# Patient Record
Sex: Female | Born: 1950
Health system: Southern US, Community
[De-identification: ages and names within clinical notes are randomized; demographics above are authoritative.]

## PROBLEM LIST (undated history)

## (undated) DIAGNOSIS — T82110A Breakdown (mechanical) of cardiac electrode, initial encounter: Secondary | ICD-10-CM

## (undated) DIAGNOSIS — Z950 Presence of cardiac pacemaker: Secondary | ICD-10-CM

## (undated) DIAGNOSIS — I341 Nonrheumatic mitral (valve) prolapse: Secondary | ICD-10-CM

## (undated) DIAGNOSIS — N200 Calculus of kidney: Secondary | ICD-10-CM

## (undated) DIAGNOSIS — Q233 Congenital mitral insufficiency: Secondary | ICD-10-CM

## (undated) HISTORY — DX: Calculus of kidney: N20.0

## (undated) HISTORY — PX: TUBAL LIGATION: SHX77

## (undated) HISTORY — DX: Congenital mitral insufficiency: Q23.3

## (undated) HISTORY — DX: Nonrheumatic mitral (valve) prolapse: I34.1

---

## 2004-11-30 ENCOUNTER — Other Ambulatory Visit: Admission: RE | Admit: 2004-11-30 | Discharge: 2004-11-30 | Payer: Self-pay | Admitting: Family Medicine

## 2012-04-01 ENCOUNTER — Other Ambulatory Visit: Payer: Self-pay | Admitting: Family Medicine

## 2012-04-01 DIAGNOSIS — Z1231 Encounter for screening mammogram for malignant neoplasm of breast: Secondary | ICD-10-CM

## 2012-04-04 ENCOUNTER — Ambulatory Visit
Admission: RE | Admit: 2012-04-04 | Discharge: 2012-04-04 | Disposition: A | Discharge status: Home / Self Care | Payer: BC Managed Care – PPO | Source: Ambulatory Visit | Attending: Family Medicine | Admitting: Family Medicine

## 2012-04-04 DIAGNOSIS — Z1231 Encounter for screening mammogram for malignant neoplasm of breast: Secondary | ICD-10-CM

## 2012-09-25 HISTORY — PX: BREAST BIOPSY: SHX20

## 2013-04-01 ENCOUNTER — Other Ambulatory Visit: Payer: Self-pay

## 2013-04-01 DIAGNOSIS — Z1231 Encounter for screening mammogram for malignant neoplasm of breast: Secondary | ICD-10-CM

## 2013-04-25 ENCOUNTER — Ambulatory Visit
Admission: RE | Admit: 2013-04-25 | Discharge: 2013-04-25 | Disposition: A | Discharge status: Home / Self Care | Payer: BC Managed Care – PPO | Source: Ambulatory Visit

## 2013-04-25 DIAGNOSIS — Z1231 Encounter for screening mammogram for malignant neoplasm of breast: Secondary | ICD-10-CM

## 2013-04-29 ENCOUNTER — Other Ambulatory Visit: Payer: Self-pay | Admitting: Family Medicine

## 2013-04-29 DIAGNOSIS — R928 Other abnormal and inconclusive findings on diagnostic imaging of breast: Secondary | ICD-10-CM

## 2013-05-13 ENCOUNTER — Ambulatory Visit
Admission: RE | Admit: 2013-05-13 | Discharge: 2013-05-13 | Disposition: A | Discharge status: Home / Self Care | Payer: BC Managed Care – PPO | Source: Ambulatory Visit | Attending: Family Medicine | Admitting: Family Medicine

## 2013-05-13 ENCOUNTER — Other Ambulatory Visit: Payer: Self-pay | Admitting: Family Medicine

## 2013-05-13 DIAGNOSIS — R928 Other abnormal and inconclusive findings on diagnostic imaging of breast: Secondary | ICD-10-CM

## 2013-05-14 ENCOUNTER — Ambulatory Visit
Admission: RE | Admit: 2013-05-14 | Discharge: 2013-05-14 | Disposition: A | Discharge status: Home / Self Care | Payer: BC Managed Care – PPO | Source: Ambulatory Visit | Attending: Family Medicine | Admitting: Family Medicine

## 2013-05-14 DIAGNOSIS — R928 Other abnormal and inconclusive findings on diagnostic imaging of breast: Secondary | ICD-10-CM

## 2014-04-16 ENCOUNTER — Other Ambulatory Visit: Payer: Self-pay

## 2014-04-16 DIAGNOSIS — Z1231 Encounter for screening mammogram for malignant neoplasm of breast: Secondary | ICD-10-CM

## 2014-05-07 ENCOUNTER — Ambulatory Visit
Admission: RE | Admit: 2014-05-07 | Discharge: 2014-05-07 | Disposition: A | Discharge status: Home / Self Care | Payer: BC Managed Care – PPO | Source: Ambulatory Visit

## 2014-05-07 DIAGNOSIS — Z1231 Encounter for screening mammogram for malignant neoplasm of breast: Secondary | ICD-10-CM

## 2014-10-13 ENCOUNTER — Other Ambulatory Visit: Payer: Self-pay | Admitting: Gastroenterology

## 2014-10-22 ENCOUNTER — Telehealth: Payer: Self-pay | Admitting: Cardiology

## 2014-10-22 NOTE — Telephone Encounter (Signed)
Received records from [facility] [place] ( Dr Marinda Elkobert Fried) for appointment on 11/10/14 with Dr Rennis GoldenHilty.  [facility] for Dr Blanchie DessertHilty's schedule on 11/10/14. lp

## 2014-11-10 ENCOUNTER — Ambulatory Visit: Payer: BC Managed Care – PPO | Admitting: Cardiology

## 2014-11-10 ENCOUNTER — Ambulatory Visit (INDEPENDENT_AMBULATORY_CARE_PROVIDER_SITE_OTHER): Payer: BC Managed Care – PPO | Admitting: Cardiology

## 2014-11-10 ENCOUNTER — Encounter: Payer: Self-pay | Admitting: Cardiology

## 2014-11-10 VITALS — BP 112/80 | HR 85 | Ht 65.0 in | Wt 150.0 lb

## 2014-11-10 DIAGNOSIS — R9431 Abnormal electrocardiogram [ECG] [EKG]: Secondary | ICD-10-CM

## 2014-11-10 DIAGNOSIS — I340 Nonrheumatic mitral (valve) insufficiency: Secondary | ICD-10-CM

## 2014-11-10 DIAGNOSIS — I341 Nonrheumatic mitral (valve) prolapse: Secondary | ICD-10-CM

## 2014-11-10 DIAGNOSIS — I493 Ventricular premature depolarization: Secondary | ICD-10-CM

## 2014-11-10 NOTE — Progress Notes (Signed)
Cardiology Office Note   Date:  11/10/2014   ID:  Donna MileBrenda L Brady, DOB 05-31-1951, MRN [id number]  PCP:  Lenora BoysFRIED, ROBERT L, MD  Cardiologist:   Rollene RotundaJames Athanasios Heldman, MD   No chief complaint on file.     History of Present Illness: Donna MileBrenda L Boehner is a 64 y.o. female who presents for evaluation of PVCs. She was noted to have ectopy recently while having a colonoscopy. She had palpitations years ago and did have an echocardiogram. I was able to review the study from 2003. There was some mitral valve prolapse with moderate mitral regurgitation. However, the patient no longer feels palpitations. She says she feels well. She does some walking for exercise and does her household chores.  The patient denies any new symptoms such as chest discomfort, neck or arm discomfort. There has been no new shortness of breath, PND or orthopnea. There have been no reported palpitations, presyncope or syncope.   Past Medical History  Diagnosis Date  . MVP (mitral valve prolapse)   . MR (congenital mitral regurgitation)   . Nephrolithiasis     Past Surgical History  Procedure Laterality Date  . Tubal ligation    . Cesarean section      3     MEDS:  None     Allergies:   Review of patient's allergies indicates not on file.    Social History:  The patient  reports that she has never smoked. She does not have any smokeless tobacco history on file.   Family History:  The patient's family history includes Cancer in her father; Dementia in her mother; Parkinson's disease in her father.    ROS:  Please see the history of present illness.   Otherwise, review of systems are positive for none.   All other systems are reviewed and negative.    PHYSICAL EXAM: VS:  BP 112/80 mmHg  Pulse 85  Ht 5\' 5"  (1.651 m)  Wt 150 lb (68.04 kg)  BMI 24.96 kg/m2 , BMI Body mass index is 24.96 kg/(m^2). GEN: Well nourished, well developed, in no acute distress HEENT: normal Neck: no JVD, carotid bruits, or  masses Cardiac: RRR; no murmurs, rubs, or gallops,no edema, positive prolapse click Respiratory:  clear to auscultation bilaterally, normal work of breathing GI: soft, nontender, nondistended, + BS MS: no deformity or atrophy Skin: warm and dry, no rash Neuro:  Strength and sensation are intact Psych: euthymic mood, full affect   EKG:  EKG is ordered today. The ekg ordered today demonssinus rhythm, rate 85, right bundle branch block incomplete, premature ventricular contraction, no acute ST-T wave changes.    Wt Readings from Last 3 Encounters:  11/10/14 150 lb (68.04 kg)      Other studies Reviewed: Additional studies/ records that were reviewed today include: Echo 2003. Review of the above records demonstrates: As above   ASSESSMENT AND PLAN:  MVP:  The patient does have mitral valve prolapse with a click. I don't appreciate a murmur. However, she has moderate MR on echo. I will repeat an echocardiogram and follow this up as indicated.  PVCs:  She is not symptomatic. I doubt that further cardiovascular testing will be indicated. We did discuss the nature of these.   Current medicines are reviewed at length with the patient today.  The patient does not have concerns regarding medicines.  The following changes have been made:  no change  Labs/ tests ordered today include: echo  Orders Placed This Encounter  Procedures  .  EKG 12-Lead     Disposition:   FU with me as needed>  Signed, Rollene Rotunda, MD  11/10/2014 8:43 AM    [facility]

## 2014-11-10 NOTE — Patient Instructions (Signed)
Your physician recommends that you schedule a follow-up appointment in: as needed with Dr. Antoine PocheHochrein  We are ordering an echo

## 2014-11-17 ENCOUNTER — Ambulatory Visit (HOSPITAL_COMMUNITY)
Admission: RE | Admit: 2014-11-17 | Discharge: 2014-11-17 | Disposition: A | Discharge status: Home / Self Care | Payer: BC Managed Care – PPO | Source: Ambulatory Visit | Attending: Cardiology | Admitting: Cardiology

## 2014-11-17 DIAGNOSIS — I493 Ventricular premature depolarization: Secondary | ICD-10-CM | POA: Diagnosis not present

## 2014-11-17 DIAGNOSIS — I340 Nonrheumatic mitral (valve) insufficiency: Secondary | ICD-10-CM

## 2014-11-17 NOTE — Progress Notes (Signed)
2D Echocardiogram Complete.  11/17/2014   Tai Syfert, RDCS  

## 2015-05-03 ENCOUNTER — Other Ambulatory Visit: Payer: Self-pay

## 2015-05-03 DIAGNOSIS — Z1231 Encounter for screening mammogram for malignant neoplasm of breast: Secondary | ICD-10-CM

## 2015-06-14 ENCOUNTER — Ambulatory Visit
Admission: RE | Admit: 2015-06-14 | Discharge: 2015-06-14 | Disposition: A | Discharge status: Home / Self Care | Payer: BC Managed Care – PPO | Source: Ambulatory Visit

## 2015-06-14 DIAGNOSIS — Z1231 Encounter for screening mammogram for malignant neoplasm of breast: Secondary | ICD-10-CM

## 2016-05-23 ENCOUNTER — Other Ambulatory Visit: Payer: Self-pay | Admitting: Family Medicine

## 2016-05-23 DIAGNOSIS — Z1231 Encounter for screening mammogram for malignant neoplasm of breast: Secondary | ICD-10-CM

## 2016-06-19 ENCOUNTER — Ambulatory Visit
Admission: RE | Admit: 2016-06-19 | Discharge: 2016-06-19 | Disposition: A | Discharge status: Home / Self Care | Payer: BC Managed Care – PPO | Source: Ambulatory Visit | Attending: Family Medicine | Admitting: Family Medicine

## 2016-06-19 DIAGNOSIS — Z1231 Encounter for screening mammogram for malignant neoplasm of breast: Secondary | ICD-10-CM

## 2016-06-22 ENCOUNTER — Other Ambulatory Visit: Payer: Self-pay | Admitting: Family Medicine

## 2016-06-22 DIAGNOSIS — R928 Other abnormal and inconclusive findings on diagnostic imaging of breast: Secondary | ICD-10-CM

## 2016-06-28 ENCOUNTER — Ambulatory Visit
Admission: RE | Admit: 2016-06-28 | Discharge: 2016-06-28 | Disposition: A | Discharge status: Home / Self Care | Payer: Medicare Other | Source: Ambulatory Visit | Attending: Family Medicine | Admitting: Family Medicine

## 2016-06-28 DIAGNOSIS — R928 Other abnormal and inconclusive findings on diagnostic imaging of breast: Secondary | ICD-10-CM

## 2017-05-23 ENCOUNTER — Other Ambulatory Visit: Payer: Self-pay | Admitting: Family Medicine

## 2017-05-23 DIAGNOSIS — Z1231 Encounter for screening mammogram for malignant neoplasm of breast: Secondary | ICD-10-CM

## 2017-06-25 ENCOUNTER — Ambulatory Visit: Payer: Medicare Other

## 2017-07-03 ENCOUNTER — Encounter: Payer: Self-pay | Admitting: Radiology

## 2017-07-03 ENCOUNTER — Ambulatory Visit
Admission: RE | Admit: 2017-07-03 | Discharge: 2017-07-03 | Disposition: A | Discharge status: Home / Self Care | Payer: Medicare Other | Source: Ambulatory Visit | Attending: Family Medicine | Admitting: Family Medicine

## 2017-07-03 DIAGNOSIS — Z1231 Encounter for screening mammogram for malignant neoplasm of breast: Secondary | ICD-10-CM

## 2018-05-28 ENCOUNTER — Other Ambulatory Visit: Payer: Self-pay | Admitting: Family Medicine

## 2018-05-28 DIAGNOSIS — Z1231 Encounter for screening mammogram for malignant neoplasm of breast: Secondary | ICD-10-CM

## 2018-07-09 ENCOUNTER — Ambulatory Visit: Payer: Medicare Other

## 2018-07-09 ENCOUNTER — Ambulatory Visit
Admission: RE | Admit: 2018-07-09 | Discharge: 2018-07-09 | Disposition: A | Discharge status: Home / Self Care | Payer: Medicare Other | Source: Ambulatory Visit | Attending: Family Medicine | Admitting: Family Medicine

## 2018-07-09 DIAGNOSIS — Z1231 Encounter for screening mammogram for malignant neoplasm of breast: Secondary | ICD-10-CM

## 2018-11-21 ENCOUNTER — Emergency Department (HOSPITAL_COMMUNITY): Payer: Medicare Other

## 2018-11-21 ENCOUNTER — Inpatient Hospital Stay (HOSPITAL_COMMUNITY)
Admission: EM | Admit: 2018-11-21 | Discharge: 2018-11-25 | DRG: 243 | Disposition: A | Discharge status: Home / Self Care | Payer: Medicare Other | Source: Ambulatory Visit | Attending: Cardiology | Admitting: Cardiology

## 2018-11-21 ENCOUNTER — Other Ambulatory Visit: Payer: Self-pay

## 2018-11-21 ENCOUNTER — Encounter (HOSPITAL_COMMUNITY): Payer: Self-pay

## 2018-11-21 DIAGNOSIS — I459 Conduction disorder, unspecified: Secondary | ICD-10-CM | POA: Diagnosis present

## 2018-11-21 DIAGNOSIS — L7632 Postprocedural hematoma of skin and subcutaneous tissue following other procedure: Secondary | ICD-10-CM | POA: Diagnosis not present

## 2018-11-21 DIAGNOSIS — R001 Bradycardia, unspecified: Secondary | ICD-10-CM | POA: Diagnosis present

## 2018-11-21 DIAGNOSIS — Q233 Congenital mitral insufficiency: Secondary | ICD-10-CM

## 2018-11-21 DIAGNOSIS — Z950 Presence of cardiac pacemaker: Secondary | ICD-10-CM | POA: Diagnosis not present

## 2018-11-21 DIAGNOSIS — Y712 Prosthetic and other implants, materials and accessory cardiovascular devices associated with adverse incidents: Secondary | ICD-10-CM | POA: Diagnosis not present

## 2018-11-21 DIAGNOSIS — T82190A Other mechanical complication of cardiac electrode, initial encounter: Secondary | ICD-10-CM | POA: Diagnosis not present

## 2018-11-21 DIAGNOSIS — Z95818 Presence of other cardiac implants and grafts: Secondary | ICD-10-CM

## 2018-11-21 DIAGNOSIS — I10 Essential (primary) hypertension: Secondary | ICD-10-CM | POA: Diagnosis not present

## 2018-11-21 DIAGNOSIS — Z87442 Personal history of urinary calculi: Secondary | ICD-10-CM | POA: Diagnosis not present

## 2018-11-21 DIAGNOSIS — Z820 Family history of epilepsy and other diseases of the nervous system: Secondary | ICD-10-CM | POA: Diagnosis not present

## 2018-11-21 DIAGNOSIS — Z959 Presence of cardiac and vascular implant and graft, unspecified: Secondary | ICD-10-CM

## 2018-11-21 DIAGNOSIS — I441 Atrioventricular block, second degree: Secondary | ICD-10-CM | POA: Diagnosis not present

## 2018-11-21 DIAGNOSIS — T82110A Breakdown (mechanical) of cardiac electrode, initial encounter: Secondary | ICD-10-CM | POA: Diagnosis not present

## 2018-11-21 DIAGNOSIS — I4510 Unspecified right bundle-branch block: Secondary | ICD-10-CM | POA: Diagnosis present

## 2018-11-21 DIAGNOSIS — T82110D Breakdown (mechanical) of cardiac electrode, subsequent encounter: Secondary | ICD-10-CM

## 2018-11-21 DIAGNOSIS — I341 Nonrheumatic mitral (valve) prolapse: Secondary | ICD-10-CM | POA: Diagnosis present

## 2018-11-21 DIAGNOSIS — R0781 Pleurodynia: Secondary | ICD-10-CM

## 2018-11-21 HISTORY — DX: Breakdown (mechanical) of cardiac electrode, initial encounter: T82.110A

## 2018-11-21 HISTORY — DX: Presence of cardiac pacemaker: Z95.0

## 2018-11-21 LAB — CBC
HCT: 42.1 % (ref 36.0–46.0)
HCT: 39.9 % (ref 36.0–46.0)
Hemoglobin: 13.1 g/dL (ref 12.0–15.0)
Hemoglobin: 12.7 g/dL (ref 12.0–15.0)
MCH: 29 pg (ref 26.0–34.0)
MCH: 28.4 pg (ref 26.0–34.0)
MCHC: 31.1 g/dL (ref 30.0–36.0)
MCHC: 31.8 g/dL (ref 30.0–36.0)
MCV: 93.1 fL (ref 80.0–100.0)
MCV: 89.3 fL (ref 80.0–100.0)
NRBC: 0 % (ref 0.0–0.2)
NRBC: 0 % (ref 0.0–0.2)
PLATELETS: 208 10*3/uL (ref 150–400)
Platelets: 202 10*3/uL (ref 150–400)
RBC: 4.52 MIL/uL (ref 3.87–5.11)
RBC: 4.47 MIL/uL (ref 3.87–5.11)
RDW: 13.2 % (ref 11.5–15.5)
RDW: 13.2 % (ref 11.5–15.5)
WBC: 7 10*3/uL (ref 4.0–10.5)
WBC: 7.4 10*3/uL (ref 4.0–10.5)

## 2018-11-21 LAB — BASIC METABOLIC PANEL
Anion gap: 8 (ref 5–15)
BUN: 17 mg/dL (ref 8–23)
CHLORIDE: 110 mmol/L (ref 98–111)
CO2: 25 mmol/L (ref 22–32)
Calcium: 9.9 mg/dL (ref 8.9–10.3)
Creatinine, Ser: 0.71 mg/dL (ref 0.44–1.00)
GFR calc non Af Amer: >60 mL/min (ref >60)
Glucose, Bld: 104 mg/dL — ABNORMAL HIGH (ref 70–99)
Potassium: 3.8 mmol/L (ref 3.5–5.1)
SODIUM: 143 mmol/L (ref 135–145)

## 2018-11-21 LAB — I-STAT TROPONIN, ED: Troponin i, poc: 0 ng/mL (ref 0.00–0.08)

## 2018-11-21 LAB — MAGNESIUM: Magnesium: 2.1 mg/dL (ref 1.7–2.4)

## 2018-11-21 LAB — CREATININE, SERUM
Creatinine, Ser: 0.77 mg/dL (ref 0.44–1.00)
GFR calc Af Amer: >60 mL/min (ref >60)

## 2018-11-21 LAB — TSH: TSH: 4.697 u[IU]/mL — ABNORMAL HIGH (ref 0.350–4.500)

## 2018-11-21 LAB — SEDIMENTATION RATE: Sed Rate: 3 mm/hr (ref 0–22)

## 2018-11-21 MED ORDER — SODIUM CHLORIDE 0.9% FLUSH: 3.0000 mL | INTRAVENOUS | Status: DC | PRN

## 2018-11-21 MED ORDER — SODIUM CHLORIDE 0.9% FLUSH: 3.0000 mL | Freq: Once | INTRAVENOUS | Status: DC

## 2018-11-21 MED ORDER — HEPARIN SODIUM (PORCINE) 5000 UNIT/ML IJ SOLN
5000.0000 [IU] | Freq: Three times a day (TID) | INTRAMUSCULAR | Status: DC
  Administered 2018-11-21 – 2018-11-22 (×2): 5000 [IU] via SUBCUTANEOUS
  Filled 2018-11-21: qty 1

## 2018-11-21 MED ORDER — SODIUM CHLORIDE 0.9 % IV SOLN
250.0000 mL | INTRAVENOUS | Status: DC | PRN
  Administered 2018-11-23: 250 mL via INTRAVENOUS

## 2018-11-21 MED ORDER — SODIUM CHLORIDE 0.9% FLUSH
3.0000 mL | Freq: Two times a day (BID) | INTRAVENOUS | Status: DC
Start: 2018-11-21 — End: 2018-11-25
  Administered 2018-11-21 – 2018-11-24 (×5): 3 mL via INTRAVENOUS

## 2018-11-21 NOTE — ED Notes (Signed)
ED TO INPATIENT HANDOFF REPORT  Name/Age/Gender Donna Brady 68 y.o. female  Code Status   Home/SNF/Other Home  Chief Complaint abnormal EKG   Level of Care/Admitting Diagnosis ED Disposition    ED Disposition Condition Comment   Admit  Hospital Area: [facility] [[id number]]  Level of Care: Progressive [102]  Diagnosis: AV block, Mobitz 2 [500938]  Admitting Physician: Lars Masson [[id number]]  Attending Physician: Lars Masson [[id number]]  Estimated length of stay: past midnight tomorrow  Certification:: I certify this patient will need inpatient services for at least 2 midnights  PT Class (Do Not Modify): Inpatient [101]  PT Acc Code (Do Not Modify): Private [1]       Medical History Past Medical History:  Diagnosis Date  . MR (congenital mitral regurgitation)   . MVP (mitral valve prolapse)   . Nephrolithiasis     Allergies Allergies  Allergen Reactions  . Adhesive [Tape] Hives    IV Location/Drains/Wounds Patient Lines/Drains/Airways Status   Active Line/Drains/Airways    Name:   Placement date:   Placement time:   Site:   Days:   Peripheral IV 11/21/18 Right Antecubital   11/21/18    1529    Antecubital   less than 1          Labs/Imaging Results for orders placed or performed during the hospital encounter of 11/21/18 (from the past 48 hour(s))  I-stat troponin, ED     Status: None   Collection Time: 11/21/18  3:27 PM  Result Value Ref Range   Troponin i, poc 0.00 0.00 - 0.08 ng/mL   Comment 3            Comment: Due to the release kinetics of cTnI, a negative result within the first hours of the onset of symptoms does not rule out myocardial infarction with certainty. If myocardial infarction is still suspected, repeat the test at appropriate intervals.   Basic metabolic panel     Status: Abnormal   Collection Time: 11/21/18  3:29 PM  Result Value Ref Range   Sodium 143 135 - 145 mmol/L   Potassium 3.8 3.5 - 5.1  mmol/L   Chloride 110 98 - 111 mmol/L   CO2 25 22 - 32 mmol/L   Glucose, Bld 104 (H) 70 - 99 mg/dL   BUN 17 8 - 23 mg/dL   Creatinine, Ser 3.81 0.44 - 1.00 mg/dL   Calcium 9.9 8.9 - 01.7 mg/dL   GFR calc non Af Amer >60 >60 mL/min   GFR calc Af Amer >60 >60 mL/min   Anion gap 8 5 - 15    Comment: Performed at [facility], [address] [place], [place] [address]  CBC     Status: None   Collection Time: 11/21/18  3:29 PM  Result Value Ref Range   WBC 7.0 4.0 - 10.5 K/uL   RBC 4.52 3.87 - 5.11 MIL/uL   Hemoglobin 13.1 12.0 - 15.0 g/dL   HCT 85.2 77.8 - 24.2 %   MCV 93.1 80.0 - 100.0 fL   MCH 29.0 26.0 - 34.0 pg   MCHC 31.1 30.0 - 36.0 g/dL   RDW 35.3 61.4 - 43.1 %   Platelets 208 150 - 400 K/uL   nRBC 0.0 0.0 - 0.2 %    Comment: Performed at [facility], [address], [place], [place] [address]   Dg Chest 2 View  Result Date: 11/21/2018 CLINICAL DATA:  68 y/o  F; abnormal EKG. EXAM: CHEST - 2 VIEW COMPARISON:  None. FINDINGS: The heart size and mediastinal contours are within normal limits. Both lungs are clear. The visualized skeletal structures are unremarkable. IMPRESSION: No acute pulmonary process identified. Cardiomediastinal silhouette within normal limits. Electronically Signed   By: Mitzi Hansen M.D.   On: 11/21/2018 15:59   EKG Interpretation  Date/Time:  Thursday November 21 2018 15:12:23 EST Ventricular Rate:  38 PR Interval:    QRS Duration: 128 QT Interval:  505 QTC Calculation: 402 R Axis:   91 Text Interpretation:  Sinus bradycardia Ventricular premature complex IVCD, consider atypical RBBB Abnormal T, consider ischemia, lateral leads No old tracing to compare 3rd degree av block Confirmed by Derwood Kaplan [phone]) on 11/21/2018 3:17:36 PM   Pending Labs Unresulted Labs (From admission, onward)    Start     Ordered   11/21/18 1529  Magnesium  ONCE - STAT,   STAT     11/21/18 1528           Vitals/Pain Today's Vitals   11/21/18 1510 11/21/18 1511 11/21/18 1512  BP:   (!) 132/43  Pulse:   (!) 37  Resp:   (!) 22  Temp:   98.2 F (36.8 C)  TempSrc:   Oral  SpO2:   99%  Weight:  67.1 kg   Height:  5\' 5"  (1.651 m)   PainSc: 0-No pain      Isolation Precautions No active isolations  Medications Medications  sodium chloride flush (NS) 0.9 % injection 3 mL (3 mLs Intravenous Not Given 11/21/18 1547)    Mobility walks

## 2018-11-21 NOTE — ED Triage Notes (Addendum)
Pt reports from her doctor's office with c/o abnormal EKG. Pt reports that her BP was elevated and she could feel her pulse in her head which is what sent her to the MD's office. Pt reports that they told her to come here reference an abnormal EKG that they took at the office and the fact that she was bradycardic.

## 2018-11-21 NOTE — ED Notes (Signed)
MD Rhunette Croft made aware of pt's low heart rate.

## 2018-11-21 NOTE — Progress Notes (Signed)
@  2145 Paged Dr. Vonzella Nipple on-call for Attending regarding pt's refusal of morning Cardiac MRI 2/2 severe claustrophobia. Will convey to Day RN to inform Day Team as well.

## 2018-11-21 NOTE — ED Provider Notes (Signed)
[facility]-EMERGENCY DEPT Provider Note   CSN: [id number] Arrival date & time: 11/21/18  1505    History   Chief Complaint Chief Complaint  Patient presents with  . Abnormal ECG    HPI Donna Brady is a 68 y.o. female.     HPI  68 year old female with history of mitral valve prolapse and mitral regurg comes in with chief complaint of abnormal EKG. Patient states that she woke up this morning and was having " whooshing" sound in her ear.  She thought that was abnormal and decided to check her vital signs and noted that her blood pressure was slightly high and her heart rate was in the 30s.  Patient proceeded to see primary care physician who confirmed the low pulse on EKG and sent her to the emergency room for further evaluation.  Patient is denying any history of hypertension and does not take any blood pressure medication.  She also denies any history of heart attack, tick bites, family history of sick sinus syndrome.   Review of system is negative for chest pain, shortness of breath, dizziness, near fainting.  Past Medical History:  Diagnosis Date  . MR (congenital mitral regurgitation)   . MVP (mitral valve prolapse)   . Nephrolithiasis     Patient Active Problem List   Diagnosis Date Noted  . AV block, Mobitz 2 11/21/2018  . Mitral regurgitation 11/10/2014  . MVP (mitral valve prolapse) 11/10/2014  . PVC's (premature ventricular contractions) 11/10/2014    Past Surgical History:  Procedure Laterality Date  . BREAST BIOPSY Right 2014  . CESAREAN SECTION     3  . TUBAL LIGATION       OB History   No obstetric history on file.      Home Medications    Prior to Admission medications   Medication Sig Start Date End Date Taking? Authorizing Provider  acetaminophen (TYLENOL) 325 MG tablet Take 650 mg by mouth every 6 (six) hours as needed for moderate pain or headache.   Yes [provider]  Multiple Vitamin (MULTIVITAMIN)  tablet Take 1 tablet by mouth daily.   Yes [provider]    Family History Family History  Problem Relation Age of Onset  . Cancer Father   . Parkinson's disease Father   . Dementia Mother     Social History Social History   Tobacco Use  . Smoking status: Never Smoker  . Smokeless tobacco: Never Used  Substance Use Topics  . Alcohol use: Not on file  . Drug use: Not on file     Allergies   Adhesive [tape]   Review of Systems Review of Systems  Constitutional: Negative for activity change.  Respiratory: Negative for shortness of breath.   Cardiovascular: Negative for chest pain.  Neurological: Negative for light-headedness.  All other systems reviewed and are negative.    Physical Exam Updated Vital Signs BP (!) 114/97   Pulse (!) 33   Temp 98.2 F (36.8 C) (Oral)   Resp 16   Ht 5\' 5"  (1.651 m)   Wt 67.1 kg   SpO2 100%   BMI 24.63 kg/m   Physical Exam Vitals signs and nursing note reviewed.  Constitutional:      Appearance: She is well-developed.  HENT:     Head: Normocephalic and atraumatic.  Neck:     Musculoskeletal: Normal range of motion and neck supple.  Cardiovascular:     Rate and Rhythm: Bradycardia present.  Pulmonary:  Effort: Pulmonary effort is normal.  Abdominal:     General: Bowel sounds are normal.  Skin:    General: Skin is warm and dry.  Neurological:     Mental Status: She is alert and oriented to person, place, and time.      ED Treatments / Results  Labs (all labs ordered are listed, but only abnormal results are displayed) Labs Reviewed  BASIC METABOLIC PANEL - Abnormal; Notable for the following components:      Result Value   Glucose, Bld 104 (*)    All other components within normal limits  CBC  MAGNESIUM  I-STAT TROPONIN, ED    EKG EKG Interpretation  Date/Time:  Thursday November 21 2018 15:12:23 EST Ventricular Rate:  38 PR Interval:    QRS Duration: 128 QT Interval:  505 QTC  Calculation: 402 R Axis:   91 Text Interpretation:  Sinus bradycardia Ventricular premature complex IVCD, consider atypical RBBB Abnormal T, consider ischemia, lateral leads No old tracing to compare 3rd degree av block Confirmed by Derwood Kaplan [phone]) on 11/21/2018 3:17:36 PM   Radiology Dg Chest 2 View  Result Date: 11/21/2018 CLINICAL DATA:  68 y/o  F; abnormal EKG. EXAM: CHEST - 2 VIEW COMPARISON:  None. FINDINGS: The heart size and mediastinal contours are within normal limits. Both lungs are clear. The visualized skeletal structures are unremarkable. IMPRESSION: No acute pulmonary process identified. Cardiomediastinal silhouette within normal limits. Electronically Signed   By: Mitzi Hansen M.D.   On: 11/21/2018 15:59    Procedures Procedures (including critical care time)  Medications Ordered in ED Medications  sodium chloride flush (NS) 0.9 % injection 3 mL (3 mLs Intravenous Not Given 11/21/18 1547)     Initial Impression / Assessment and Plan / ED Course  I have reviewed the triage vital signs and the nursing notes.  Pertinent labs & imaging results that were available during my care of the patient were reviewed by me and considered in my medical decision making (see chart for details).        68 year old female comes in with chief complaint of low heart rate.  It appears that she is asymptomatic with it, and quite possibly walking around with bradycardia for few days.  She does not have any ischemic findings on the EKG and she denies any chest pain, shortness of breath, dizziness or near fainting.   She has history of MVP, therefore this could be a structural problem leading to abnormal circuit issues.  It does not appear that the bradycardia is because of toxins or medications.  I discussed the case with Dr. Delton See, who would like the patient to be admitted to [facility] progressive unit.  Final Clinical Impressions(s) / ED Diagnoses   Final  diagnoses:  Mobitz type 2 second degree atrioventricular block    ED Discharge Orders    None       Derwood Kaplan, MD 11/21/18 1746

## 2018-11-21 NOTE — H&P (Signed)
Cardiology Admission History and Physical:   Patient ID: Donna Brady MRN: [id number]; DOB: September 20, 1951   Admission date: 11/21/2018  Primary Care Provider: Joycelyn Rua, MD Primary Cardiologist: Dr. Antoine Poche Chief Complaint: Weakness, dizziness  Patient Profile:   Donna Brady is a 68 y.o. female with new diagnosis of advanced AV block.  History of Present Illness:   Donna Brady is a pleasant [age] year old female who was previously evaluated by Dr. Antoine Poche for PVCs during the colonoscopy procedure, at the time she was otherwise asymptomatic and underwent echocardiography that showed EF of 60 to 65% with mild mitral valve prolapse with moderate mitral regurgitation.  She otherwise has no other cardiac history such as hypertension or hyperlipidemia basically on no medications. She states that she woke up today with bruit in her ears that is not normal for her so she checked her vital sounds and noted that her blood pressure was slightly elevated but her heart rate was in 30s.  She den went to her primary care physician office who confirmed her bradycardia and she was sent to the ER.  The patient is on no antihypertensive medications, she also denies any recent fever or chills, no nausea or vomiting, no recent febrile illness no tick bites.    She has no h/o thyroid disease, denies any recent weigt gain or loss, no acute febrile illness.  She also denies any dizziness, no presyncope or syncope. She admits to some fatigue in the afternoons in the last week or two and some SOB while walking her dog uphill this am.    Past Medical History:  Diagnosis Date  . MR (congenital mitral regurgitation)   . MVP (mitral valve prolapse)   . Nephrolithiasis     Past Surgical History:  Procedure Laterality Date  . BREAST BIOPSY Right 2014  . CESAREAN SECTION     3  . TUBAL LIGATION       Medications Prior to Admission: Prior to Admission medications   Medication Sig Start Date End  Date Taking? Authorizing Provider  acetaminophen (TYLENOL) 325 MG tablet Take 650 mg by mouth every 6 (six) hours as needed for moderate pain or headache.   Yes [provider]  Multiple Vitamin (MULTIVITAMIN) tablet Take 1 tablet by mouth daily.   Yes [provider]     Allergies:    Allergies  Allergen Reactions  . Adhesive [Tape] Hives    Social History:   Social History   Socioeconomic History  . Marital status: Married    Spouse name: Not on file  . Number of children: 3  . Years of education: Not on file  . Highest education level: Not on file  Occupational History  . Not on file  Social Needs  . Financial resource strain: Not on file  . Food insecurity:    Worry: Not on file    Inability: Not on file  . Transportation needs:    Medical: Not on file    Non-medical: Not on file  Tobacco Use  . Smoking status: Never Smoker  . Smokeless tobacco: Never Used  Substance and Sexual Activity  . Alcohol use: Not on file  . Drug use: Not on file  . Sexual activity: Not on file  Lifestyle  . Physical activity:    Days per week: Not on file    Minutes per session: Not on file  . Stress: Not on file  Relationships  . Social connections:    Talks on phone: Not  on file    Gets together: Not on file    Attends religious service: Not on file    Active member of club or organization: Not on file    Attends meetings of clubs or organizations: Not on file    Relationship status: Not on file  . Intimate partner violence:    Fear of current or ex partner: Not on file    Emotionally abused: Not on file    Physically abused: Not on file    Forced sexual activity: Not on file  Other Topics Concern  . Not on file  Social History Narrative   Lives at home with husband.    Family History:   The patient's family history includes Cancer in her father; Dementia in her mother; Parkinson's disease in her father.    ROS:  Please see the history of present  illness.  All other ROS reviewed and negative.     Physical Exam/Data:   Vitals:   11/21/18 1615 11/21/18 1630 11/21/18 1645 11/21/18 1700  BP: 112/61 129/65 124/65 (!) 114/97  Pulse: (!) 35 (!) 37 (!) 36 (!) 33  Resp: 17 15 14 16   Temp:      TempSrc:      SpO2: 95% 99% 97% 100%  Weight:      Height:       No intake or output data in the 24 hours ending 11/21/18 1809 Last 3 Weights 11/21/2018 11/10/2014  Weight (lbs) 148 lb 150 lb  Weight (kg) 67.132 kg 68.04 kg     Body mass index is 24.63 kg/m.  General:  Well nourished, well developed, in no acute distress HEENT: normal Lymph: no adenopathy Neck: no JVD Endocrine:  No thryomegaly Vascular: No carotid bruits; FA pulses 2+ bilaterally without bruits  Cardiac:  normal S1, S2; RRR; 3 out of 6 systolic murmur Lungs:  clear to auscultation bilaterally, no wheezing, rhonchi or rales  Abd: soft, nontender, no hepatomegaly  Ext: no edema Musculoskeletal:  No deformities, BUE and BLE strength normal and equal Skin: warm and dry  Neuro:  CNs 2-12 intact, no focal abnormalities noted Psych:  Normal affect    EKG:  The ECG that was done was personally reviewed and demonstrates a high degree 3:1 block with RBBB  Relevant CV Studies:  Echocardiogram 11/17/2014  - Left ventricle: The cavity size was normal. Systolic function was normal. The estimated ejection fraction was in the range of 55% to 60%. Wall motion was normal; there were no regional wall motion abnormalities. Doppler parameters are consistent with abnormal left ventricular relaxation (grade 1 diastolic dysfunction). - Mitral valve: There was mild regurgitation.  Impressions:  - There was no obvious prolapse of mitral valve as was reported on 2003 echocardiogram.  Laboratory Data:  Chemistry Recent Labs  Lab 11/21/18 1529  NA 143  K 3.8  CL 110  CO2 25  GLUCOSE 104*  BUN 17  CREATININE 0.71  CALCIUM 9.9  GFRNONAA >60  GFRAA >60    ANIONGAP 8    No results for input(s): PROT, ALBUMIN, AST, ALT, ALKPHOS, BILITOT in the last 168 hours. Hematology Recent Labs  Lab 11/21/18 1529  WBC 7.0  RBC 4.52  HGB 13.1  HCT 42.1  MCV 93.1  MCH 29.0  MCHC 31.1  RDW 13.2  PLT 208   Cardiac EnzymesNo results for input(s): TROPONINI in the last 168 hours.  Recent Labs  Lab 11/21/18 1527  TROPIPOC 0.00    BNPNo results for input(s):  BNP, PROBNP in the last 168 hours.  DDimer No results for input(s): DDIMER in the last 168 hours.  Radiology/Studies:  Dg Chest 2 View  Result Date: 11/21/2018 CLINICAL DATA:  68 y/o  F; abnormal EKG. EXAM: CHEST - 2 VIEW COMPARISON:  None. FINDINGS: The heart size and mediastinal contours are within normal limits. Both lungs are clear. The visualized skeletal structures are unremarkable. IMPRESSION: No acute pulmonary process identified. Cardiomediastinal silhouette within normal limits. Electronically Signed   By: Mitzi Hansen M.D.   On: 11/21/2018 15:59    Assessment and Plan:   1. High degree 3:1 block with RBBB - no obvious preceding illness, no tick bite, thyroid disease, no meds, no evidence for a sarcoidosis - I will obtain TSH. FT3,fT4, sed rate - obtain echocardiogram and cardiac MRI - EP consult in the am and most probably a PM placement tomorrow - no need for a temporary pacer as she is asymptomatic and BP in 140'  Severity of Illness: The appropriate patient status for this patient is INPATIENT. Inpatient status is judged to be reasonable and necessary in order to provide the required intensity of service to ensure the patient's safety. The patient's presenting symptoms, physical exam findings, and initial radiographic and laboratory data in the context of their chronic comorbidities is felt to place them at high risk for further clinical deterioration. Furthermore, it is not anticipated that the patient will be medically stable for discharge from the hospital within  2 midnights of admission. The following factors support the patient status of inpatient.   " The patient's presenting symptoms include SOB " The worrisome physical exam findings include high degree AVB. " The initial radiographic and laboratory data are worrisome because of high degree AVB. " The chronic co-morbidities include HTN.  * I certify that at the point of admission it is my clinical judgment that the patient will require inpatient hospital care spanning beyond 2 midnights from the point of admission due to high intensity of service, high risk for further deterioration and high frequency of surveillance required.*   For questions or updates, please contact [facility] Please consult [email or web address] for contact info under   Signed, Tobias Alexander, MD  11/21/2018 6:09 PM

## 2018-11-22 ENCOUNTER — Inpatient Hospital Stay (HOSPITAL_COMMUNITY)
Admission: EM | Disposition: A | Discharge status: Home / Self Care | Payer: Self-pay | Source: Ambulatory Visit | Attending: Cardiology

## 2018-11-22 ENCOUNTER — Observation Stay (HOSPITAL_COMMUNITY): Payer: Medicare Other

## 2018-11-22 DIAGNOSIS — I340 Nonrheumatic mitral (valve) insufficiency: Secondary | ICD-10-CM

## 2018-11-22 DIAGNOSIS — Y712 Prosthetic and other implants, materials and accessory cardiovascular devices associated with adverse incidents: Secondary | ICD-10-CM | POA: Diagnosis present

## 2018-11-22 DIAGNOSIS — Z820 Family history of epilepsy and other diseases of the nervous system: Secondary | ICD-10-CM | POA: Diagnosis not present

## 2018-11-22 DIAGNOSIS — I4510 Unspecified right bundle-branch block: Secondary | ICD-10-CM | POA: Diagnosis present

## 2018-11-22 DIAGNOSIS — L7632 Postprocedural hematoma of skin and subcutaneous tissue following other procedure: Secondary | ICD-10-CM | POA: Diagnosis present

## 2018-11-22 DIAGNOSIS — I441 Atrioventricular block, second degree: Secondary | ICD-10-CM | POA: Diagnosis present

## 2018-11-22 DIAGNOSIS — Z87442 Personal history of urinary calculi: Secondary | ICD-10-CM | POA: Diagnosis not present

## 2018-11-22 DIAGNOSIS — I10 Essential (primary) hypertension: Secondary | ICD-10-CM | POA: Diagnosis present

## 2018-11-22 DIAGNOSIS — T82190A Other mechanical complication of cardiac electrode, initial encounter: Secondary | ICD-10-CM | POA: Diagnosis not present

## 2018-11-22 DIAGNOSIS — T82110A Breakdown (mechanical) of cardiac electrode, initial encounter: Secondary | ICD-10-CM | POA: Diagnosis not present

## 2018-11-22 DIAGNOSIS — Q233 Congenital mitral insufficiency: Secondary | ICD-10-CM | POA: Diagnosis not present

## 2018-11-22 DIAGNOSIS — R001 Bradycardia, unspecified: Secondary | ICD-10-CM | POA: Diagnosis present

## 2018-11-22 HISTORY — PX: PACEMAKER IMPLANT: EP1218

## 2018-11-22 LAB — CBC
HCT: 38.3 % (ref 36.0–46.0)
Hemoglobin: 12 g/dL (ref 12.0–15.0)
MCH: 28.1 pg (ref 26.0–34.0)
MCHC: 31.3 g/dL (ref 30.0–36.0)
MCV: 89.7 fL (ref 80.0–100.0)
NRBC: 0 % (ref 0.0–0.2)
Platelets: 184 10*3/uL (ref 150–400)
RBC: 4.27 MIL/uL (ref 3.87–5.11)
RDW: 13.2 % (ref 11.5–15.5)
WBC: 6.8 10*3/uL (ref 4.0–10.5)

## 2018-11-22 LAB — SURGICAL PCR SCREEN
MRSA, PCR: NEGATIVE
Staphylococcus aureus: NEGATIVE

## 2018-11-22 LAB — BASIC METABOLIC PANEL
Anion gap: 7 (ref 5–15)
BUN: 14 mg/dL (ref 8–23)
CO2: 25 mmol/L (ref 22–32)
CREATININE: 0.75 mg/dL (ref 0.44–1.00)
Calcium: 9.1 mg/dL (ref 8.9–10.3)
Chloride: 108 mmol/L (ref 98–111)
GFR calc Af Amer: >60 mL/min (ref >60)
GFR calc non Af Amer: >60 mL/min (ref >60)
Glucose, Bld: 97 mg/dL (ref 70–99)
Potassium: 3.9 mmol/L (ref 3.5–5.1)
Sodium: 140 mmol/L (ref 135–145)

## 2018-11-22 LAB — T4, FREE: Free T4: 0.88 ng/dL (ref 0.82–1.77)

## 2018-11-22 LAB — ECHOCARDIOGRAM COMPLETE
Height: 65 in
Weight: 2402.13 oz

## 2018-11-22 LAB — HIV ANTIBODY (ROUTINE TESTING W REFLEX): HIV Screen 4th Generation wRfx: NONREACTIVE

## 2018-11-22 SURGERY — PACEMAKER IMPLANT

## 2018-11-22 MED ORDER — SODIUM CHLORIDE 0.9 % IV SOLN
INTRAVENOUS | Status: AC
  Filled 2018-11-22: qty 2

## 2018-11-22 MED ORDER — SODIUM CHLORIDE 0.9% FLUSH: 3.0000 mL | INTRAVENOUS | Status: DC | PRN

## 2018-11-22 MED ORDER — LIDOCAINE HCL (PF) 1 % IJ SOLN
INTRAMUSCULAR | Status: DC | PRN
  Administered 2018-11-22: 60 mL

## 2018-11-22 MED ORDER — SODIUM CHLORIDE 0.9% FLUSH: 3.0000 mL | Freq: Two times a day (BID) | INTRAVENOUS | Status: DC

## 2018-11-22 MED ORDER — SODIUM CHLORIDE 0.9 % IV SOLN
INTRAVENOUS | Status: DC
  Administered 2018-11-22: 13:00:00 via INTRAVENOUS

## 2018-11-22 MED ORDER — MIDAZOLAM HCL 5 MG/5ML IJ SOLN
INTRAMUSCULAR | Status: AC
  Filled 2018-11-22: qty 5

## 2018-11-22 MED ORDER — IOPAMIDOL (ISOVUE-370) INJECTION 76%
INTRAVENOUS | Status: AC
  Filled 2018-11-22: qty 50

## 2018-11-22 MED ORDER — CEFAZOLIN SODIUM-DEXTROSE 1-4 GM/50ML-% IV SOLN
1.0000 g | Freq: Four times a day (QID) | INTRAVENOUS | Status: AC
  Administered 2018-11-22 – 2018-11-23 (×3): 1 g via INTRAVENOUS
  Filled 2018-11-22 (×3): qty 50

## 2018-11-22 MED ORDER — FENTANYL CITRATE (PF) 100 MCG/2ML IJ SOLN
INTRAMUSCULAR | Status: AC
  Filled 2018-11-22: qty 2

## 2018-11-22 MED ORDER — CHLORHEXIDINE GLUCONATE 4 % EX LIQD: 60.0000 mL | Freq: Once | CUTANEOUS | Status: DC

## 2018-11-22 MED ORDER — SODIUM CHLORIDE 0.9 % IV SOLN
80.0000 mg | INTRAVENOUS | Status: AC
  Administered 2018-11-22: 80 mg

## 2018-11-22 MED ORDER — HEPARIN (PORCINE) IN NACL 1000-0.9 UT/500ML-% IV SOLN
INTRAVENOUS | Status: DC | PRN
  Administered 2018-11-22: 500 mL

## 2018-11-22 MED ORDER — CEFAZOLIN SODIUM-DEXTROSE 2-4 GM/100ML-% IV SOLN
2.0000 g | INTRAVENOUS | Status: AC
  Administered 2018-11-22: 2 g via INTRAVENOUS

## 2018-11-22 MED ORDER — SODIUM CHLORIDE 0.9 % IV SOLN: 250.0000 mL | INTRAVENOUS | Status: DC

## 2018-11-22 MED ORDER — LIDOCAINE HCL 1 % IJ SOLN
INTRAMUSCULAR | Status: AC
  Filled 2018-11-22: qty 60

## 2018-11-22 MED ORDER — CEFAZOLIN SODIUM-DEXTROSE 2-4 GM/100ML-% IV SOLN
INTRAVENOUS | Status: AC
  Filled 2018-11-22: qty 100

## 2018-11-22 MED ORDER — ACETAMINOPHEN 325 MG PO TABS
650.0000 mg | ORAL_TABLET | Freq: Four times a day (QID) | ORAL | Status: DC | PRN
  Administered 2018-11-22: 650 mg via ORAL
  Filled 2018-11-22: qty 2

## 2018-11-22 MED ORDER — ONDANSETRON HCL 4 MG/2ML IJ SOLN: 4.0000 mg | Freq: Four times a day (QID) | INTRAMUSCULAR | Status: DC | PRN

## 2018-11-22 MED ORDER — TRAMADOL HCL 50 MG PO TABS
50.0000 mg | ORAL_TABLET | Freq: Four times a day (QID) | ORAL | Status: DC | PRN
  Administered 2018-11-22 – 2018-11-23 (×3): 50 mg via ORAL
  Filled 2018-11-22 (×3): qty 1

## 2018-11-22 MED ORDER — HEPARIN (PORCINE) IN NACL 1000-0.9 UT/500ML-% IV SOLN
INTRAVENOUS | Status: AC
  Filled 2018-11-22: qty 500

## 2018-11-22 SURGICAL SUPPLY — 8 items
CABLE SURGICAL S-101-97-12 (CABLE) ×2 IMPLANT
IPG PACE AZUR XT DR MRI W1DR01 (Pacemaker) IMPLANT
LEAD CAPSURE NOVUS 5076-52CM (Lead) ×1 IMPLANT
LEAD CAPSURE NOVUS 5076-58CM (Lead) ×1 IMPLANT
PACE AZURE XT DR MRI W1DR01 (Pacemaker) ×2 IMPLANT
PAD PRO RADIOLUCENT 2001M-C (PAD) ×2 IMPLANT
SHEATH CLASSIC 7F (SHEATH) ×2 IMPLANT
TRAY PACEMAKER INSERTION (PACKS) ×2 IMPLANT

## 2018-11-22 NOTE — Consult Note (Addendum)
Cardiology Consultation:   Patient ID: Donna Brady MRN: [id number]; DOB: August 30, 1951  Admit date: 11/21/2018 Date of Consult: 11/22/2018  Primary Care Provider: Joycelyn Rua, MD Primary Cardiologist: Dr. Antoine Poche (2016) Primary Electrophysiologist:  None    Patient Profile:   Donna Brady is a 68 y.o. female with a vague hx of MVP, has been seen in 2016 for PVCs noted incidentally with a unremarkable echo at that time without further w/u, no noted MVP or significant MR, who is being seen today for the evaluation of advanced heart block at the request of Dr. Delton See.  History of Present Illness:   [name] for the last 2 days has woken to an awareness of her heart beat upon waking, she noted her BP 140's, HR 40's, a friend who is a nurse suggested that if she was otherwise feeling well to keep an eye on it.  She denies any overt symptoms, no SOB, no exertional changes, walking her dog a number of times daily without intolerances.  No CP, palpitations, no dizzy spells, near syncope or syncope.  She woke the next AM (yesterday) with the same awareness of her pulse in her head/ears, again her HR 40's and BP similar as the day prior, she went to her PMD, who found her EKG abnormal and sent her to the ER at [facility].  There she was transferred to [facility] noting advande heart block.  As somewhat of an after though today she mentions perhaps yesterday and t oday feeling a little "off" or "zoned out".  She has not been febrile or sick of late, with a <24hour bug of fever, diarrhea > 1 month ago, no travel, no hiking/camping, tic bites.   LABS K+ 3.8, 3.9 Mag 2.1 BUN/Creat 14/0.75 poc Trop 0.00 WBC 6.8 H/H 12/38/Plts 184 Trop 4.697  Past Medical History:  Diagnosis Date  . MR (congenital mitral regurgitation)   . MVP (mitral valve prolapse)   . Nephrolithiasis     Past Surgical History:  Procedure Laterality Date  . BREAST BIOPSY Right 2014  . CESAREAN SECTION     3  . TUBAL LIGATION        Home Medications:  Prior to Admission medications   Medication Sig Start Date End Date Taking? Authorizing Provider  acetaminophen (TYLENOL) 325 MG tablet Take 650 mg by mouth every 6 (six) hours as needed for moderate pain or headache.   Yes [provider]  Multiple Vitamin (MULTIVITAMIN) tablet Take 1 tablet by mouth daily.   Yes [provider]    Inpatient Medications: Scheduled Meds: . heparin  5,000 Units Subcutaneous Q8H  . sodium chloride flush  3 mL Intravenous Once  . sodium chloride flush  3 mL Intravenous Q12H   Continuous Infusions: . sodium chloride     PRN Meds: sodium chloride, sodium chloride flush  Allergies:    Allergies  Allergen Reactions  . Adhesive [Tape] Hives    Social History:   Social History   Socioeconomic History  . Marital status: Married    Spouse name: Not on file  . Number of children: 3  . Years of education: Not on file  . Highest education level: Not on file  Occupational History  . Not on file  Social Needs  . Financial resource strain: Not on file  . Food insecurity:    Worry: Not on file    Inability: Not on file  . Transportation needs:    Medical: Not on file  Non-medical: Not on file  Tobacco Use  . Smoking status: Never Smoker  . Smokeless tobacco: Never Used  Substance and Sexual Activity  . Alcohol use: Not on file  . Drug use: Not on file  . Sexual activity: Not on file  Lifestyle  . Physical activity:    Days per week: Not on file    Minutes per session: Not on file  . Stress: Not on file  Relationships  . Social connections:    Talks on phone: Not on file    Gets together: Not on file    Attends religious service: Not on file    Active member of club or organization: Not on file    Attends meetings of clubs or organizations: Not on file    Relationship status: Not on file  . Intimate partner violence:    Fear of current or ex partner: Not on file    Emotionally abused: Not on  file    Physically abused: Not on file    Forced sexual activity: Not on file  Other Topics Concern  . Not on file  Social History Narrative   Lives at home with husband.    Family History:   Family History  Problem Relation Age of Onset  . Cancer Father   . Parkinson's disease Father   . Dementia Mother      ROS:  Please see the history of present illness.  All other ROS reviewed and negative.     Physical Exam/Data:   Vitals:   11/21/18 2020 11/21/18 2135 11/22/18 0315 11/22/18 0821  BP: (!) 140/53 (!) 119/59 (!) 104/42 (!) 115/54  Pulse:    (!) 32  Resp: Temp: 98.7 F (37.1 C)  98.1 F (36.7 C) 98.4 F (36.9 C)  TempSrc: Oral  Oral Oral  SpO2: 97% 95% 96% 96%  Weight:   68.1 kg   Height:    (1.651 m)     Intake/Output Summary (Last 24 hours) at 11/22/2018 1100 Last data filed at 11/22/2018 0315 Gross per 24 hour  Intake 600 ml  Output -  Net 600 ml   Last 3 Weights 11/22/2018 11/21/2018 11/10/2014  Weight (lbs) 150 lb 2.1 oz 148 lb 150 lb  Weight (kg) 68.1 kg 67.132 kg 68.04 kg     Body mass index is 24.98 kg/m.  General:  Well nourished, well developed, in no acute distress HEENT: normal Lymph: no adenopathy Neck: no JVD Endocrine:  No thryomegaly Vascular: No carotid bruits  Cardiac:  RRR; bradycardiic no murmurs, gallops or rubs Lungs:  CTA b/l, no wheezing, rhonchi or rales  Abd: soft, nontender  Ext: no edema Musculoskeletal:  No deformities Skin: warm and dry  Neuro:  no focal abnormalities noted Psych:  Normal affect   EKG:  The EKG was personally reviewed and demonstrates:   3;1 AVBlock, V rate 38bpm, icRBBB/IVCD, QRS  IVCD unchanged for 2016  Telemetry:  Telemetry was personally reviewed and demonstrates:   Generally 3:1 AVBlock, some 2:1, occ PVCs  Relevant CV Studies:  11/17/2014: TTE Study Conclusions - Left ventricle: The cavity size was normal. Systolic function was normal. The estimated ejection  fraction was in the range of 55% to 60%. Wall motion was normal; there were no regional wall motion abnormalities. Doppler parameters are consistent with abnormal left ventricular relaxation (grade 1 diastolic dysfunction). - Mitral valve: There was mild regurgitation. Impressions - There was no obvious prolapse of mitral valve  as was reported on 2003 echocardiogram.  Laboratory Data:  Chemistry Recent Labs  Lab 11/21/18 1529 11/21/18 2032 11/22/18 0314  NA 143  --  140  K 3.8  --  3.9  CL 110  --  108  CO2 25  --  25  GLUCOSE 104*  --  97  BUN 17  --  14  CREATININE 0.71 0.77 0.75  CALCIUM 9.9  --  9.1  GFRNONAA >60 >60 >60  GFRAA >60 >60 >60  ANIONGAP 8  --  7    No results for input(s): PROT, ALBUMIN, AST, ALT, ALKPHOS, BILITOT in the last 168 hours. Hematology Recent Labs  Lab 11/21/18 1529 11/21/18 2032 11/22/18 0314  WBC 7.0 7.4 6.8  RBC 4.52 4.47 4.27  HGB 13.1 12.7 12.0  HCT 42.1 39.9 38.3  MCV 93.1 89.3 89.7  MCH 29.0 28.4 28.1  MCHC 31.1 31.8 31.3  RDW 13.2 13.2 13.2  PLT 208 202 184   Cardiac EnzymesNo results for input(s): TROPONINI in the last 168 hours.  Recent Labs  Lab 11/21/18 1527  TROPIPOC 0.00    BNPNo results for input(s): BNP, PROBNP in the last 168 hours.  DDimer No results for input(s): DDIMER in the last 168 hours.  Radiology/Studies:   Dg Chest 2 View Result Date: 11/21/2018 CLINICAL DATA:  68 y/o  F; abnormal EKG. EXAM: CHEST - 2 VIEW COMPARISON:  None. FINDINGS: The heart size and mediastinal contours are within normal limits. Both lungs are clear. The visualized skeletal structures are unremarkable. IMPRESSION: No acute pulmonary process identified. Cardiomediastinal silhouette within normal limits. Electronically Signed   By: Mitzi Hansen M.D.   On: 11/21/2018 15:59    Assessment and Plan:   1. Advanced heart block with Mobitz II AVblock     BP stable     No reversible causes     No symptoms to  suggest ischemia/angina     Pt declines MRI 2/2 severe claustrophobia, Lorece Keach d/c  2d echo is done, pending read  She Quadry Kampa need PPM.   We discussed recommendation, rational, the procedure, its potential risks and benefits, she is agreeable to proceed.  Dr. Elberta Fortis has seen and examined the patient    For questions or updates, please contact [facility] Please consult [email or web address] for contact info under     Signed, Sheilah Pigeon, PA-C  11/22/2018 11:00 AM  I have seen and examined this patient with Francis Dowse.  Agree with above, note added to reflect my findings.  On exam, RRR, no murmurs, lungs clear. Patient presented with SOB and fatigue, found to be in second degree AV block. No obvious reversible causes noted. Plan for pacemaker.  Cipriano Mile has presented today for surgery, with the diagnosis of second degree AV block.  The various methods of treatment have been discussed with the patient and family. After consideration of risks, benefits and other options for treatment, the patient has consented to  Procedure(s): pacemaker as a surgical intervention .  Risks include but not limited to bleeding, tamponade, infection, pneumothorax, among others. The patient's history has been reviewed, patient examined, no change in status, stable for surgery.  I have reviewed the patient's chart and labs.  Questions were answered to the patient's satisfaction.       Jeray Shugart M. Sajid Ruppert MD 11/22/2018 11:34 AM

## 2018-11-22 NOTE — Progress Notes (Signed)
  Echocardiogram 2D Echocardiogram has been performed.  Donna Brady 11/22/2018, 10:33 AM

## 2018-11-22 NOTE — Progress Notes (Signed)
Pressure dressing applied.

## 2018-11-23 ENCOUNTER — Inpatient Hospital Stay (HOSPITAL_COMMUNITY): Payer: Medicare Other

## 2018-11-23 ENCOUNTER — Encounter (HOSPITAL_COMMUNITY)
Admission: EM | Disposition: A | Discharge status: Home / Self Care | Payer: Self-pay | Source: Ambulatory Visit | Attending: Cardiology

## 2018-11-23 ENCOUNTER — Encounter (HOSPITAL_COMMUNITY): Payer: Self-pay | Admitting: Cardiology

## 2018-11-23 DIAGNOSIS — T82110A Breakdown (mechanical) of cardiac electrode, initial encounter: Secondary | ICD-10-CM

## 2018-11-23 DIAGNOSIS — Z950 Presence of cardiac pacemaker: Secondary | ICD-10-CM

## 2018-11-23 HISTORY — DX: Presence of cardiac pacemaker: Z95.0

## 2018-11-23 HISTORY — PX: PACEMAKER REVISION: EP1221

## 2018-11-23 LAB — BASIC METABOLIC PANEL
ANION GAP: 11 (ref 5–15)
BUN: 10 mg/dL (ref 8–23)
CHLORIDE: 103 mmol/L (ref 98–111)
CO2: 24 mmol/L (ref 22–32)
Calcium: 9.2 mg/dL (ref 8.9–10.3)
Creatinine, Ser: 0.53 mg/dL (ref 0.44–1.00)
GFR calc Af Amer: >60 mL/min (ref >60)
GFR calc non Af Amer: >60 mL/min (ref >60)
Glucose, Bld: 107 mg/dL — ABNORMAL HIGH (ref 70–99)
Potassium: 3.7 mmol/L (ref 3.5–5.1)
Sodium: 138 mmol/L (ref 135–145)

## 2018-11-23 LAB — CBC
HEMATOCRIT: 41.1 % (ref 36.0–46.0)
Hemoglobin: 13.5 g/dL (ref 12.0–15.0)
MCH: 28.9 pg (ref 26.0–34.0)
MCHC: 32.8 g/dL (ref 30.0–36.0)
MCV: 88 fL (ref 80.0–100.0)
Platelets: 189 10*3/uL (ref 150–400)
RBC: 4.67 MIL/uL (ref 3.87–5.11)
RDW: 12.8 % (ref 11.5–15.5)
WBC: 10.2 10*3/uL (ref 4.0–10.5)
nRBC: 0 % (ref 0.0–0.2)

## 2018-11-23 LAB — T3, FREE: T3, Free: 3.7 pg/mL (ref 2.0–4.4)

## 2018-11-23 SURGERY — PACEMAKER REVISION
Anesthesia: LOCAL

## 2018-11-23 MED ORDER — MIDAZOLAM HCL 5 MG/5ML IJ SOLN
INTRAMUSCULAR | Status: AC
  Filled 2018-11-23: qty 5

## 2018-11-23 MED ORDER — FENTANYL CITRATE (PF) 100 MCG/2ML IJ SOLN
INTRAMUSCULAR | Status: DC | PRN
  Administered 2018-11-23 (×3): 25 ug via INTRAVENOUS

## 2018-11-23 MED ORDER — MIDAZOLAM HCL 5 MG/5ML IJ SOLN
INTRAMUSCULAR | Status: DC | PRN
  Administered 2018-11-23 (×2): 1 mg via INTRAVENOUS

## 2018-11-23 MED ORDER — HEPARIN (PORCINE) IN NACL 1000-0.9 UT/500ML-% IV SOLN
INTRAVENOUS | Status: DC | PRN
  Administered 2018-11-23: 500 mL

## 2018-11-23 MED ORDER — SODIUM CHLORIDE 0.9 % IV SOLN
INTRAVENOUS | Status: AC
  Administered 2018-11-23: 16:00:00 via INTRAVENOUS

## 2018-11-23 MED ORDER — CEFAZOLIN SODIUM-DEXTROSE 1-4 GM/50ML-% IV SOLN
1.0000 g | Freq: Four times a day (QID) | INTRAVENOUS | Status: AC
Start: 2018-11-23 — End: 2018-11-24
  Administered 2018-11-23 – 2018-11-24 (×3): 1 g via INTRAVENOUS
  Filled 2018-11-23 (×3): qty 50

## 2018-11-23 MED ORDER — FENTANYL CITRATE (PF) 100 MCG/2ML IJ SOLN
INTRAMUSCULAR | Status: AC
  Filled 2018-11-23: qty 2

## 2018-11-23 MED ORDER — SODIUM CHLORIDE 0.9 % IV SOLN
INTRAVENOUS | Status: AC
  Filled 2018-11-23: qty 2

## 2018-11-23 MED ORDER — ACETAMINOPHEN 325 MG PO TABS: 325.0000 mg | ORAL_TABLET | ORAL | Status: DC | PRN

## 2018-11-23 MED ORDER — ONDANSETRON HCL 4 MG/2ML IJ SOLN: 4.0000 mg | Freq: Four times a day (QID) | INTRAMUSCULAR | Status: DC | PRN

## 2018-11-23 MED ORDER — LIDOCAINE HCL 1 % IJ SOLN
INTRAMUSCULAR | Status: AC
  Filled 2018-11-23: qty 40

## 2018-11-23 MED ORDER — HEPARIN (PORCINE) IN NACL 1000-0.9 UT/500ML-% IV SOLN
INTRAVENOUS | Status: AC
  Filled 2018-11-23: qty 500

## 2018-11-23 MED ORDER — CEFAZOLIN SODIUM-DEXTROSE 2-4 GM/100ML-% IV SOLN
INTRAVENOUS | Status: AC
  Filled 2018-11-23: qty 100

## 2018-11-23 MED ORDER — SODIUM CHLORIDE 0.9 % IV SOLN
INTRAVENOUS | Status: DC
  Administered 2018-11-23: 16:00:00 via INTRAVENOUS

## 2018-11-23 MED ORDER — CEFAZOLIN SODIUM-DEXTROSE 2-4 GM/100ML-% IV SOLN: 2.0000 g | INTRAVENOUS | Status: DC

## 2018-11-23 MED ORDER — TRAMADOL HCL 50 MG PO TABS: 50.0000 mg | ORAL_TABLET | Freq: Two times a day (BID) | ORAL | 0 refills | Status: DC | PRN

## 2018-11-23 MED ORDER — SODIUM CHLORIDE 0.9 % IV SOLN
80.0000 mg | INTRAVENOUS | Status: AC
  Administered 2018-11-23: 80 mg

## 2018-11-23 MED ORDER — LIDOCAINE HCL (PF) 1 % IJ SOLN
INTRAMUSCULAR | Status: DC | PRN
  Administered 2018-11-23: 60 mL

## 2018-11-23 SURGICAL SUPPLY — 9 items
CABLE SURGICAL S-101-97-12 (CABLE) ×2 IMPLANT
HEMOSTAT SURGICEL 2X4 FIBR (HEMOSTASIS) ×1 IMPLANT
KIT MICROPUNCTURE NIT STIFF (SHEATH) ×1 IMPLANT
LEAD CAPSURE NOVUS 5076-58CM (Lead) ×1 IMPLANT
PAD PRO RADIOLUCENT 2001M-C (PAD) ×2 IMPLANT
POUCH AIGIS-R ANTIBACT PPM (Mesh General) ×2 IMPLANT
POUCH AIGIS-R ANTIBACT PPM MED (Mesh General) IMPLANT
SHEATH CLASSIC 7F (SHEATH) ×1 IMPLANT
TRAY PACEMAKER INSERTION (PACKS) ×2 IMPLANT

## 2018-11-23 NOTE — Progress Notes (Signed)
      Patient Care Team: Meyers, Stephen, MD as PCP - General (Family Medicine)   HPI  Donna Brady is a 68 y.o. female Admitted with high grade heart block   Echo normal LV function  Paced yday WC  No complaints overnight.  Had a small hematoma.  Compressed  Began to complain of left inframammary discomfort that was nonpleuritic.  Records and Results Reviewed   Past Medical History:  Diagnosis Date  . MR (congenital mitral regurgitation)   . MVP (mitral valve prolapse)   . Nephrolithiasis     Past Surgical History:  Procedure Laterality Date  . BREAST BIOPSY Right 2014  . CESAREAN SECTION     3  . TUBAL LIGATION      Current Meds  Medication Sig  . acetaminophen (TYLENOL) 325 MG tablet Take 650 mg by mouth every 6 (six) hours as needed for moderate pain or headache.  . Multiple Vitamin (MULTIVITAMIN) tablet Take 1 tablet by mouth daily.    Allergies  Allergen Reactions  . Adhesive [Tape] Hives      Review of Systems negative except from HPI and PMH  Physical Exam BP (!) 107/59 (BP Location: Right Arm)   Pulse 75   Temp 98.5 F (36.9 C) (Oral)   Resp (!) 22   Ht 5' 5" (1.651 m)   Wt 68.6 kg   SpO2 95%   BMI 25.18 kg/m  Well developed and well nourished in no acute distress HENT normal E scleral and icterus clear Neck Supple JVP flat; carotids brisk and full Clear to ausculation Pocket with hematoma, swelling or tenderness Regular rate and rhythm, no murmurs gallops or rub Soft with active bowel sounds No clubbing cyanosis  Edema Alert and oriented, grossly normal motor and sensory function Skin Warm and Dry    Assessment and  Plan  High grade heart block  Pacemkaer [employer]onic  Device function normal   Between the time when I begin this note saw the patient she began to develop pacemaker failure to capture.  She will need revision.  Orders written I discussed this with the patient over the phone and [employer] is  aware.     Current medicines are reviewed at length with the patient today .  The patient does not have concerns regarding medicines.  

## 2018-11-23 NOTE — Progress Notes (Signed)
Pt arrived from Cath lab in bed. Sling to RUE. Dressing in place. No drainage noted. Op site unable to assess due to dressing. Pt c/o 0 pain using 0-10 pain scale. Pt oriented and alert. Daughter and friend at bedside speaking with Dr. Graciela Husbands. NS at 50 ml/hr. V/S obtained see flowsheet. Pt V-Paced. SCD's placed on pt bilaterally LE.

## 2018-11-23 NOTE — H&P (View-Only) (Signed)
      Patient Care Team: Joycelyn Rua, MD as PCP - General (Family Medicine)   HPI  Donna Brady is a 68 y.o. female Admitted with high grade heart block   Echo normal LV function  Paced yday WC  No complaints overnight.  Had a small hematoma.  Compressed  Began to complain of left inframammary discomfort that was nonpleuritic.  Records and Results Reviewed   Past Medical History:  Diagnosis Date  . MR (congenital mitral regurgitation)   . MVP (mitral valve prolapse)   . Nephrolithiasis     Past Surgical History:  Procedure Laterality Date  . BREAST BIOPSY Right 2014  . CESAREAN SECTION     3  . TUBAL LIGATION      Current Meds  Medication Sig  . acetaminophen (TYLENOL) 325 MG tablet Take 650 mg by mouth every 6 (six) hours as needed for moderate pain or headache.  . Multiple Vitamin (MULTIVITAMIN) tablet Take 1 tablet by mouth daily.    Allergies  Allergen Reactions  . Adhesive [Tape] Hives      Review of Systems negative except from HPI and PMH  Physical Exam BP (!) 107/59 (BP Location: Right Arm)   Pulse 75   Temp 98.5 F (36.9 C) (Oral)   Resp (!) 22   Ht 5\' 5"  (1.651 m)   Wt 68.6 kg   SpO2 95%   BMI 25.18 kg/m  Well developed and well nourished in no acute distress HENT normal E scleral and icterus clear Neck Supple JVP flat; carotids brisk and full Clear to ausculation Pocket with hematoma, swelling or tenderness Regular rate and rhythm, no murmurs gallops or rub Soft with active bowel sounds No clubbing cyanosis  Edema Alert and oriented, grossly normal motor and sensory function Skin Warm and Dry    Assessment and  Plan  High grade heart block  Pacemkaer [employer]  Device function normal   Between the time when I begin this note saw the patient she began to develop pacemaker failure to capture.  She will need revision.  Orders written I discussed this with the patient over the phone and [employer] is  aware.     Current medicines are reviewed at length with the patient today .  The patient does not have concerns regarding medicines.

## 2018-11-23 NOTE — Progress Notes (Signed)
Orthopedic Tech Progress Note Patient Details:  Donna Brady [birth year] [id number] Called back up there just to make sure patient has arm in sling and her RN said she does. Patient ID: Donna Brady, female   DOB: 02/20/1951, 68 y.o.   MRN: [id number]   Donald Pore 11/23/2018, 4:08 PM

## 2018-11-23 NOTE — Progress Notes (Signed)
Orthopedic Tech Progress Note Patient Details:  Donna Brady [birth year] [id number] RN said patient has on arm sling Patient ID: Donna Brady, female   DOB: 27-May-1951, 68 y.o.   MRN: [id number]   Donald Pore 11/23/2018, 7:42 AM

## 2018-11-23 NOTE — Progress Notes (Signed)
Hanging abt. Pt c/o discomfort to chest area under throat. Pt able to swallow. Skin pinkish. No visual trauma. Pt states" I had my neck turned a long time". Pt feels may be from procedure. Pt in no distress no problem breathing and no c/o pain. Will continue to monitor.

## 2018-11-23 NOTE — Progress Notes (Signed)
Pt has developed non capture with ventricular lead.  Seems to have more discomfort with this as well.  Still with 2:1 Block underneath. Will keep on bed rest.  Dr. Graciela Husbands to call pt .  Rep will attempt to improve capture - with interrogation.  But otherwise will not discharge, will make NPO and plans to take back to cath lab to reposition the lead.

## 2018-11-23 NOTE — Interval H&P Note (Signed)
History and Physical Interval Note:  11/23/2018 2:10 PM  Donna Brady  has presented today for surgery, with the diagnosis of lead revision  The various methods of treatment have been discussed with the patient and family. After consideration of risks, benefits and other options for treatment, the patient has consented to  Procedure(s): PACEMAKER REVISION (N/A) as a surgical intervention .  The patient's history has been reviewed, patient examined, no change in status, stable for surgery.  I have reviewed the patient's chart and labs.  Questions were answered to the patient's satisfaction.     Sherryl Manges

## 2018-11-24 ENCOUNTER — Inpatient Hospital Stay (HOSPITAL_COMMUNITY): Payer: Medicare Other

## 2018-11-24 ENCOUNTER — Encounter (HOSPITAL_COMMUNITY): Payer: Self-pay | Admitting: Cardiology

## 2018-11-24 DIAGNOSIS — T82110A Breakdown (mechanical) of cardiac electrode, initial encounter: Secondary | ICD-10-CM

## 2018-11-24 HISTORY — DX: Breakdown (mechanical) of cardiac electrode, initial encounter: T82.110A

## 2018-11-24 NOTE — Progress Notes (Signed)
      Patient Care Team: Joycelyn Rua, MD as PCP - General (Family Medicine) Rollene Rotunda, MD as PCP - Cardiology (Cardiology) Regan Lemming, MD as PCP - Electrophysiology (Cardiology)   HPI  Donna Brady is a 68 y.o. female Admitted with high grade heart block   Echo normal LV function  Paced Friday WC with subsequent microperf and RV lead failure with lead replacement yday  Without pain or shortness of breath  Records and Results Reviewed   Past Medical History:  Diagnosis Date  . MR (congenital mitral regurgitation)   . MVP (mitral valve prolapse)   . Nephrolithiasis   . S/P placement of cardiac pacemaker 11/22/18 MDT  11/23/2018    Past Surgical History:  Procedure Laterality Date  . BREAST BIOPSY Right 2014  . CESAREAN SECTION     3  . TUBAL LIGATION      Current Meds  Medication Sig  . acetaminophen (TYLENOL) 325 MG tablet Take 650 mg by mouth every 6 (six) hours as needed for moderate pain or headache.  . Multiple Vitamin (MULTIVITAMIN) tablet Take 1 tablet by mouth daily.    Allergies  Allergen Reactions  . Adhesive [Tape] Hives      Review of Systems negative except from HPI and PMH  Physical Exam BP 107/62 (BP Location: Right Arm)   Pulse 88   Temp 98.7 F (37.1 C) (Oral)   Resp 17   Ht 5\' 5"  (1.651 m)   Wt 68.6 kg   SpO2 94%   BMI 25.18 kg/m  Well developed and nourished in no acute distress HENT normal Neck supple with JVP-flat Clear Regular rate and rhythm, no murmurs or gallops Abd-soft with active BS No Clubbing cyanosis edema Skin-warm and dry A & Oriented  Grossly normal sensory and motor function Pocket without  hematoma, swelling or tenderness   Chest Xray personally reviewed  Lead position stable  Report notes subcutaneous air   anterior to the pocket   Device function normal   Assessment and  Plan  High grade heart block  RV lead failure  Replaced   Pacemkaer [employer]    device function is  normal  No PTx    Reviewed XR with Radiology and CT with DR Delton Coombes, both by telephone.  SubQ air is noted without pTx its most frequent accompaniment  Not sure explanation but thought better part of valor is overnight observation  Reviewed with pt    Current medicines are reviewed at length with the patient today .  The patient does not have concerns regarding medicines.

## 2018-11-25 ENCOUNTER — Encounter (HOSPITAL_COMMUNITY): Payer: Self-pay | Admitting: Cardiology

## 2018-11-25 ENCOUNTER — Inpatient Hospital Stay (HOSPITAL_COMMUNITY): Payer: Medicare Other

## 2018-11-25 MED FILL — Midazolam HCl Inj 5 MG/5ML (Base Equivalent): INTRAMUSCULAR | Qty: 5 | Status: AC

## 2018-11-25 MED FILL — Fentanyl Citrate Preservative Free (PF) Inj 100 MCG/2ML: INTRAMUSCULAR | Qty: 2 | Status: AC

## 2018-11-25 MED FILL — Lidocaine HCl Local Inj 1%: INTRAMUSCULAR | Qty: 60 | Status: AC

## 2018-11-25 NOTE — Discharge Instructions (Signed)
° ° °  Supplemental Discharge Instructions for  Pacemaker/Defibrillator Patients  Activity No heavy lifting or vigorous activity with your left/right arm for 6 to 8 weeks.  Do not raise your left/right arm above your head for one week.  Gradually raise your affected arm as drawn below.             11/27/2018                   3/52020                  11/29/2018                 11/30/2018 __  NO DRIVING for  1 week   ; you may begin driving on  04/25/8562   .  WOUND CARE - Keep the wound area clean and dry.  Do not get this area wet, no showers until cleared to at your wound check visit - The tape/steri-strips on your wound will fall off; do not pull them off.  No bandage is needed on the site.  DO  NOT apply any creams, oils, or ointments to the wound area. - If you notice any drainage or discharge from the wound, any swelling or bruising at the site, or you develop a fever > 101? F after you are discharged home, call the office at once.  Special Instructions - You are still able to use cellular telephones; use the ear opposite the side where you have your pacemaker/defibrillator.  Avoid carrying your cellular phone near your device. - When traveling through airports, show security personnel your identification card to avoid being screened in the metal detectors.  Ask the security personnel to use the hand wand. - Avoid arc welding equipment, MRI testing (magnetic resonance imaging), TENS units (transcutaneous nerve stimulators).  Call the office for questions about other devices. - Avoid electrical appliances that are in poor condition or are not properly grounded. - Microwave ovens are safe to be near or to operate.

## 2018-11-25 NOTE — Care Management Note (Signed)
Case Management Note Donn Pierini RN, BSN Transitions of Care Unit 4E- RN Case Manager [phone]  Patient Details  Name: Donna Brady MRN: [id number] Date of Birth: Mar 15, 1951  Subjective/Objective:   Pt admitted with AV heart block, s/p PPM                 Action/Plan: PTA pt lived at home, plan to return home, no CM needs noted for transition home.   Expected Discharge Date:  11/25/18               Expected Discharge Plan:  Home/Self Care  In-House Referral:  NA  Discharge planning Services  CM Consult  Post Acute Care Choice:  NA Choice offered to:  NA  DME Arranged:    DME Agency:     HH Arranged:    HH Agency:     Status of Service:  Completed, signed off  If discussed at Long Length of Stay Meetings, dates discussed:    Discharge Disposition: home/self care   Additional Comments:  Darrold Span, RN 11/25/2018, 10:23 AM

## 2018-11-25 NOTE — Progress Notes (Signed)
11/25/2018 11:25 AM Discharge AVS meds taken today and those due this evening reviewed by SWAT nurse Ginger as well as  follow-up appointments and when to call md reviewed.  D/C IV and TELE.  Questions and concerns addressed.   D/C home per orders. Kathryne Hitch

## 2018-11-25 NOTE — Progress Notes (Signed)
Discharge instructions (including medications) discussed with and copy provided to patient/caregiver. All belongings sent with patient. 

## 2018-11-25 NOTE — Discharge Summary (Addendum)
ELECTROPHYSIOLOGY PROCEDURE DISCHARGE SUMMARY    Patient ID: Donna Brady,  MRN: [id number], DOB/AGE: 1950-11-07 68 y.o.  Admit date: 11/21/2018 Discharge date: 11/25/2018  Primary Care Physician: Joycelyn Rua, MD  Primary Cardiologist: Dr. Antoine Poche Electrophysiologist: Dr. Elberta Fortis  Primary Discharge Diagnosis:  1. Advanced heart block, Mobitz II  Secondary Discharge Diagnosis:  none  Allergies  Allergen Reactions  . Adhesive [Tape] Hives     Procedures This Admission:  1.  Implantation of a MDT dual chamber PPM on 11/22/2018 by Dr Elberta Fortis.  The patient received a  There were no immediate post procedure complications.  Medtronic model [id number] (serial number [id number]) right atrial lead and a Medtronic model 5076 (serial number [id number]) right ventricular lead, Medtronic Azure XT DR MRI SureScan (serial number [id number]) pacemaker CXR on 11/23/2018 demonstrated no pneumothorax status post device implantation.   2. 11/23/2018: RV lead revision, Dr. Graciela Husbands Medtronic MRI compatible [id number] ventricular lead serial [id number]  CXR 11/24/2018 without ptx, noted subcutaneous emphysema CXR 11/25/2018 no pneumothorax, no mention of subcutaneous emphysema/air  Brief HPI: Donna Brady is a 68 y.o. female with a vague hx of MVP, has been seen in 2016 for PVCs noted incidentally with a unremarkable echo at that time without further w/u.  A couple days prior to her admission she woke to an awareness of her heart beat upon waking, she noted her BP 140's, HR 40's, a friend who is a nurse suggested that if she was otherwise feeling well to keep an eye on it.  She denies any overt symptoms, no SOB, no exertional changes, walking her dog a number of times daily without intolerances.  No CP, palpitations, no dizzy spells, near syncope or syncope.  She woke the next AM (yesterday) with the same awareness of her pulse in her head/ears, again her HR 40's and BP similar as the day prior, she  went to her PMD, who found her EKG abnormal and sent her to the ER at [facility].  There she was transferred to [facility] noting advanced heart block.   Hospital Course:  The patient was admitted and after discussion as a somewhat of an after thought today she mentioned perhaps the last couple days having felt a little "off" or "zoned out".  She had not been febrile or sick of late, with a <24hour bug of fever, diarrhea > 1 month ago, no travel, no hiking/camping, tic bites, no reversible causes were found.  She had a TTE noting LVEF 55-60%, bowing without prolapse of her MV, no significant VHD,  She underwent PPM implantation.  She was planned for discharge the following morning with stable device check and CXR, though developed low left CP and lack of capture on her telemetry and required RV lead revision by Dr. Graciela Husbands.  Post op CXR Sunday noted subq emphysema, though no ptx.  CT chest was completed and confirmned no pneumothorax.  It was decided to observe another 24 hours. The day of discharge this morning, left chest is without hematoma or ecchymosis or appreciable subcutaneous emphysema.  The device was interrogated POD #1 and found to be functioning normally.  CXR this morning with atelectasis, patient is encouraged OOB and ambulate.  She has no respiratory complaints or symptoms.  She has remained without CP post lead revision, telemetry with SR/Vpacing, no evidence of non-capture or inappropriate pacing.  Wound care, arm mobility, and restrictions were reviewed with the patient.  The patient feels well, wants to go home,  she was examined by Dr. Elberta Fortis and considered stable for discharge to home. She was going home with Tramadol Saturday, this Rx already in for her, she does not feel like she needs pain medicine with no plans to pick up, she Maurisa Tesmer let her pharmacy know.   Physical Exam: Vitals:   11/24/18 1932 11/25/18 0001 11/25/18 0304 11/25/18 0750  BP: 109/62 108/60 108/60 111/66  Pulse: 92 90 93 93    Resp: 19 12 13 18   Temp: 98.5 F (36.9 C) 97.8 F (36.6 C) 98.3 F (36.8 C) 99.3 F (37.4 C)  TempSrc: Oral Oral Oral Oral  SpO2: 95% 95% 95% 97%  Weight:      Height:        GEN- The patient is well appearing, alert and oriented x 3 today.   HEENT: normocephalic, atraumatic; sclera clear, conjunctiva pink; hearing intact; oropharynx clear; neck supple, no JVP Lungs- CTA b/l, normal work of breathing.  No wheezes, rales, rhonchi Heart- RRR, no murmurs, rubs or gallops, PMI not laterally displaced GI- soft, non-tender, non-distended Extremities- no clubbing, cyanosis, or edema MS- no significant deformity or atrophy Skin- warm and dry, no rash or lesion, left chest without hematoma/ecchymosis, no appreciable subQ air Psych- euthymic mood, full affect Neuro- no gross deficits   Labs:   Lab Results  Component Value Date   WBC 10.2 11/23/2018   HGB 13.5 11/23/2018   HCT 41.1 11/23/2018   MCV 88.0 11/23/2018   PLT 189 11/23/2018    Recent Labs  Lab 11/23/18 1604  NA 138  K 3.7  CL 103  CO2 24  BUN 10  CREATININE 0.53  CALCIUM 9.2  GLUCOSE 107*    Discharge Medications:  Allergies as of 11/25/2018      Reactions   Adhesive [tape] Hives      Medication List    TAKE these medications   acetaminophen 325 MG tablet Commonly known as:  TYLENOL Take 650 mg by mouth every 6 (six) hours as needed for moderate pain or headache.   multivitamin tablet Take 1 tablet by mouth daily.   traMADol 50 MG tablet Commonly known as:  ULTRAM Take 1 tablet (50 mg total) by mouth every 12 (twelve) hours as needed for moderate pain.       Disposition: home  Discharge Instructions    Diet - low sodium heart healthy   Complete by:  As directed    Increase activity slowly   Complete by:  As directed      Follow-up Information    [facility] Office Follow up.   Specialty:  Cardiology Why:  12/03/2018 @ 9:30AM Contact information: [address],  [address] [place] [address] [phone]       Regan Lemming, MD Follow up.   Specialty:  Cardiology Why:  02/28/2019 @ 2:30PM Contact information: [address] [place] [address] [phone]           Duration of Discharge Encounter: Greater than 30 minutes including physician time.  Norma Fredrickson, PA-C 11/25/2018 9:54 AM  I have seen and examined this patient with Francis Dowse.  Agree with above, note added to reflect my findings.  On exam, RRR, no murmurs, lungs clear.  She is now status post Medtronic pacemaker for 2:1 AV block.  Complicated by RV lead dislodgement and microperforation with lead revsion complicated by subcutaneous air without pneumothorax.   Device now functioning appropriately.  Chest x-ray and  interrogation without issue.  Plan for discharge today with follow-up in device clinic.  Jeaneane Adamec M. Sahvanna Mcmanigal MD 11/25/2018 9:12 PM

## 2018-12-03 ENCOUNTER — Ambulatory Visit (INDEPENDENT_AMBULATORY_CARE_PROVIDER_SITE_OTHER): Payer: Medicare Other | Admitting: Nurse Practitioner

## 2018-12-03 DIAGNOSIS — I441 Atrioventricular block, second degree: Secondary | ICD-10-CM

## 2018-12-03 LAB — CUP PACEART INCLINIC DEVICE CHECK
Battery Remaining Longevity: 133 mo
Battery Voltage: 3.18 V
Brady Statistic AP VP Percent: 0.3 %
Brady Statistic AP VS Percent: 0 %
Brady Statistic AS VP Percent: 99.41 %
Brady Statistic RA Percent Paced: 0.33 %
Brady Statistic RV Percent Paced: 99.72 %
Date Time Interrogation Session: 20200310095531
Implantable Lead Implant Date: 20200228
Implantable Lead Location: 753858
Implantable Lead Location: 753859
Implantable Lead Model: 5076
Implantable Lead Model: 5076
Implantable Pulse Generator Implant Date: 20200228
Lead Channel Impedance Value: 304 Ohm
Lead Channel Impedance Value: 361 Ohm
Lead Channel Impedance Value: 456 Ohm
Lead Channel Pacing Threshold Amplitude: 0.5 V
Lead Channel Pacing Threshold Pulse Width: 0.4 ms
Lead Channel Sensing Intrinsic Amplitude: 3.375 mV
Lead Channel Sensing Intrinsic Amplitude: 5.75 mV
Lead Channel Sensing Intrinsic Amplitude: 8.375 mV
Lead Channel Setting Pacing Amplitude: 3.5 V
Lead Channel Setting Pacing Amplitude: 3.5 V
Lead Channel Setting Pacing Pulse Width: 0.4 ms
Lead Channel Setting Sensing Sensitivity: 2.8 mV
MDC IDC LEAD IMPLANT DT: 20200228
MDC IDC MSMT LEADCHNL RA IMPEDANCE VALUE: 399 Ohm
MDC IDC MSMT LEADCHNL RA SENSING INTR AMPL: 3.5 mV
MDC IDC MSMT LEADCHNL RV PACING THRESHOLD AMPLITUDE: 0.75 V
MDC IDC MSMT LEADCHNL RV PACING THRESHOLD PULSEWIDTH: 0.4 ms
MDC IDC STAT BRADY AS VS PERCENT: 0.28 %

## 2018-12-03 NOTE — Progress Notes (Signed)

## 2019-02-20 ENCOUNTER — Telehealth: Payer: Self-pay | Admitting: *Deleted

## 2019-02-20 NOTE — Telephone Encounter (Signed)
Calling patient today to discuss upcoming appointment.  We are currently trying to limit exposure to the virus that causes COVID-19 by seeing patients at home rather than in the office. We would like to schedule this appointment as a Virtual Appointment VIA Smartphone or Laptop. Unable to reach patient.  LVMTCB  

## 2019-02-27 ENCOUNTER — Ambulatory Visit (INDEPENDENT_AMBULATORY_CARE_PROVIDER_SITE_OTHER): Payer: Medicare Other | Admitting: *Deleted

## 2019-02-27 DIAGNOSIS — I441 Atrioventricular block, second degree: Secondary | ICD-10-CM | POA: Diagnosis not present

## 2019-02-27 LAB — CUP PACEART REMOTE DEVICE CHECK
Battery Remaining Longevity: 151 mo
Battery Voltage: 3.15 V
Brady Statistic AP VP Percent: 6.78 %
Brady Statistic AP VS Percent: 0.19 %
Brady Statistic AS VP Percent: 92.41 %
Brady Statistic AS VS Percent: 0.62 %
Brady Statistic RA Percent Paced: 7.03 %
Brady Statistic RV Percent Paced: 99.19 %
Date Time Interrogation Session: 20200603232158
Implantable Lead Implant Date: 20200228
Implantable Lead Implant Date: 20200228
Implantable Lead Location: 753858
Implantable Lead Location: 753859
Implantable Lead Model: 5076
Implantable Lead Model: 5076
Implantable Pulse Generator Implant Date: 20200228
Lead Channel Impedance Value: 323 Ohm
Lead Channel Impedance Value: 399 Ohm
Lead Channel Impedance Value: 475 Ohm
Lead Channel Impedance Value: 589 Ohm
Lead Channel Pacing Threshold Amplitude: 0.5 V
Lead Channel Pacing Threshold Amplitude: 0.5 V
Lead Channel Pacing Threshold Pulse Width: 0.4 ms
Lead Channel Pacing Threshold Pulse Width: 0.4 ms
Lead Channel Sensing Intrinsic Amplitude: 4.5 mV
Lead Channel Sensing Intrinsic Amplitude: 6.375 mV
Lead Channel Setting Pacing Amplitude: 1.5 V
Lead Channel Setting Pacing Amplitude: 2 V
Lead Channel Setting Pacing Pulse Width: 0.4 ms
Lead Channel Setting Sensing Sensitivity: 2.8 mV

## 2019-02-28 ENCOUNTER — Other Ambulatory Visit: Payer: Self-pay

## 2019-02-28 ENCOUNTER — Telehealth (INDEPENDENT_AMBULATORY_CARE_PROVIDER_SITE_OTHER): Payer: Medicare Other | Admitting: Cardiology

## 2019-02-28 DIAGNOSIS — I441 Atrioventricular block, second degree: Secondary | ICD-10-CM

## 2019-02-28 NOTE — Progress Notes (Signed)
Electrophysiology TeleHealth Note   Due to national recommendations of social distancing due to COVID 19, an audio/video telehealth visit is felt to be most appropriate for this patient at this time.  See Epic message for the patient's consent to telehealth [facility].   Date:  02/28/2019   ID:  Donna Brady, DOB 29-Sep-1950, MRN [id number]  Location: patient's home  Provider location: [address], [place]  Evaluation Performed: Follow-up visit  PCP:  Mitzi Hansenaniel, Janice, NP  Cardiologist:  Rollene RotundaJames Hochrein, MD  Electrophysiologist:  Dr Elberta Fortisamnitz  Chief Complaint:  pacemaker  History of Present Illness:    Donna Brady is a 68 y.o. female who presents via audio/video conferencing for a telehealth visit today.  Since last being seen in our clinic, the patient reports doing very well.  Today, she denies symptoms of palpitations, chest pain, shortness of breath,  lower extremity edema, dizziness, presyncope, or syncope.  The patient is otherwise without complaint today.  The patient denies symptoms of fevers, chills, cough, or new SOB worrisome for COVID 19.  She has a history of mitral regurgitation and mitral valve prolapse.  She presented to the hospital March 2020 with advanced heart block and had a Medtronic dual-chamber pacemaker implanted.  Unfortunately, this required RV lead revision due to loss of capture..  Today, denies symptoms of palpitations, chest pain, shortness of breath, orthopnea, PND, lower extremity edema, claudication, dizziness, presyncope, syncope, bleeding, or neurologic sequela. The patient is tolerating medications without difficulties.  She is overall feeling well.  She has no chest pain or shortness of breath.  She is able to do all her daily activities.  She does still have some soreness on the lateral side of her device, though she does not have any evidence of infection.  Past Medical History:  Diagnosis Date  . MR (congenital mitral  regurgitation)   . MVP (mitral valve prolapse)   . Nephrolithiasis   . Pacemaker lead malfunction due to dislodgement with removal of lead and new lead placed 11/23/18 11/24/2018  . S/P placement of cardiac pacemaker 11/22/18 MDT  11/23/2018    Past Surgical History:  Procedure Laterality Date  . BREAST BIOPSY Right 2014  . CESAREAN SECTION     3  . PACEMAKER IMPLANT N/A 11/22/2018   Procedure: PACEMAKER IMPLANT;  Surgeon: Regan Lemmingamnitz, Lenon Kuennen Martin, MD;  Location: [facility] INVASIVE CV LAB;  Service: Cardiovascular;  Laterality: N/A;  . PACEMAKER REVISION N/A 11/23/2018   Procedure: PACEMAKER REVISION;  Surgeon: Duke SalviaKlein, Steven C, MD;  [facility];  Service: Cardiovascular;  Laterality: N/A;  . TUBAL LIGATION      Current Outpatient Medications  Medication Sig Dispense Refill  . acetaminophen (TYLENOL) 325 MG tablet Take 650 mg by mouth every 6 (six) hours as needed for moderate pain or headache.    . Multiple Vitamin (MULTIVITAMIN) tablet Take 1 tablet by mouth daily.    . traMADol (ULTRAM) 50 MG tablet Take 1 tablet (50 mg total) by mouth every 12 (twelve) hours as needed for moderate pain. 5 tablet 0   No current facility-administered medications for this visit.     Allergies:   Adhesive [tape]   Social History:  The patient  reports that she has never smoked. She has never used smokeless tobacco.   Family History:  The patient's  family history includes Cancer in her father; Dementia in her mother; Parkinson's disease in her father.   ROS:  Please see  the history of present illness.   All other systems are personally reviewed and negative.    Exam:    Vital Signs:  BP 119/64   Pulse (!) 104   Well appearing, alert and conversant, regular work of breathing,  good skin color Eyes- anicteric, neuro- grossly intact, skin- no apparent rash or lesions or cyanosis, mouth- oral mucosa is pink   Labs/Other Tests and Data Reviewed:    Recent Labs: 11/21/2018: Magnesium 2.1; TSH  4.697 11/23/2018: BUN 10; Creatinine, Ser 0.53; Hemoglobin 13.5; Platelets 189; Potassium 3.7; Sodium 138   Wt Readings from Last 3 Encounters:  11/23/18 151 lb 4.8 oz (68.6 kg)  11/10/14 150 lb (68 kg)     Other studies personally reviewed: Additional studies/ records that were reviewed today include: ECG 11/24/2018 personally reviewed  Review of the above records today demonstrates: Atrial sensed, ventricular paced  Last device remote is reviewed from PaceART PDF dated 12/03/2018 which reveals normal device function, no arrhythmias    ASSESSMENT & PLAN:    1.  Second-degree heart block: Status post Medtronic dual-chamber pacemaker implanted 11/22/2018.   COVID 19 screen The patient denies symptoms of COVID 19 at this time.  The importance of social distancing was discussed today.  Follow-up: 9 months  Current medicines are reviewed at length with the patient today.   The patient does not have concerns regarding her medicines.  The following changes were made today:  none  Labs/ tests ordered today include:  No orders of the defined types were placed in this encounter.    Patient Risk:  after full review of this patients clinical status, I feel that they are at moderate risk at this time.  Today, I have spent 12 minutes with the patient with telehealth technology discussing pacemaker.    Signed, Unice Vantassel Jorja Loa, MD  02/28/2019 2:21 PM     [facility] [address] [place] [address] [phone] (office) [phone] (fax)

## 2019-02-28 NOTE — Telephone Encounter (Signed)
Pt agreeable to telemedicine visit.     Virtual Visit Pre-Appointment Phone Call  "(Name), I am calling you today to discuss your upcoming appointment. We are currently trying to limit exposure to the virus that causes COVID-19 by seeing patients at home rather than in the office."  1. "What is the BEST phone number to call the day of the visit?" - include this in appointment notes  2. "Do you have or have access to (through a family member/friend) a smartphone with video capability that we can use for your visit?" a. If yes - list this number in appt notes as "cell" (if different from BEST phone #) and list the appointment type as a VIDEO visit in appointment notes b. If no - list the appointment type as a PHONE visit in appointment notes  3. Confirm consent - "In the setting of the current Covid19 crisis, you are scheduled for a (phone or video) visit with your provider on (date) at (time).  Just as we do with many in-office visits, in order for you to participate in this visit, we must obtain consent.  If you'd like, I can send this to your mychart (if signed up) or email for you to review.  Otherwise, I can obtain your verbal consent now.  All virtual visits are billed to your insurance company just like a normal visit would be.  By agreeing to a virtual visit, we'd like you to understand that the technology does not allow for your provider to perform an examination, and thus may limit your provider's ability to fully assess your condition. If your provider identifies any concerns that need to be evaluated in person, we will make arrangements to do so.  Finally, though the technology is pretty good, we cannot assure that it will always work on either your or our end, and in the setting of a video visit, we may have to convert it to a phone-only visit.  In either situation, we cannot ensure that we have a secure connection.  Are you willing to proceed?" STAFF: Did the patient verbally acknowledge  consent to telehealth visit? Document YES/NO here: YES  4. Advise patient to be prepared - "Two hours prior to your appointment, go ahead and check your blood pressure, pulse, oxygen saturation, and your weight (if you have the equipment to check those) and write them all down. When your visit starts, your provider will ask you for this information. If you have an Apple Watch or Kardia device, please plan to have heart rate information ready on the day of your appointment. Please have a pen and paper handy nearby the day of the visit as well."  5. Give patient instructions for MyChart download to smartphone OR Doximity/Doxy.me as below if video visit (depending on what platform provider is using)  6. Inform patient they will receive a phone call 15 minutes prior to their appointment time (may be from unknown caller ID) so they should be prepared to answer    TELEPHONE CALL NOTE  AYDEE CLARA has been deemed a candidate for a follow-up tele-health visit to limit community exposure during the Covid-19 pandemic. I spoke with the patient via phone to ensure availability of phone/video source, confirm preferred email & phone number, and discuss instructions and expectations.  I reminded Donna Brady to be prepared with any vital sign and/or heart rhythm information that could potentially be obtained via home monitoring, at the time of her visit. I reminded Donna Brady  to expect a phone call prior to her visit.  Baird LyonsPrice,  L, RN 02/28/2019 8:13 AM   INSTRUCTIONS FOR DOWNLOADING THE MYCHART APP TO SMARTPHONE  - The patient must first make sure to have activated MyChart and know their login information - If Apple, go to [employer] Store and type in MyChart in the search bar and download the app. If Android, ask patient to go to [employer] Play Store and type in [place] in the search bar and download the app. The app is free but as with any other app downloads, their phone may require them to verify  saved payment information or Apple/Android password.  - The patient will need to then log into the app with their MyChart username and password, and select [facility] as their healthcare provider to link the account. When it is time for your visit, go to the MyChart app, find appointments, and click Begin Video Visit. Be sure to Select Allow for your device to access the Microphone and Camera for your visit. You will then be connected, and your provider will be with you shortly.  **If they have any issues connecting, or need assistance please contact MyChart service desk [phone])**  **If using a computer, in order to ensure the best quality for their visit they will need to use either of the following Internet Browsers: [employer] Edge, or Google Chrome**  IF USING DOXIMITY or DOXY.ME - The patient will receive a link just prior to their visit by text.     FULL LENGTH CONSENT FOR TELE-HEALTH VISIT   I hereby voluntarily request, consent and authorize [facility] and its employed or contracted physicians, physician assistants, nurse practitioners or other licensed health care professionals (the Practitioner), to provide me with telemedicine health care services (the "Services") as deemed necessary by the treating Practitioner. I acknowledge and consent to receive the Services by the Practitioner via telemedicine. I understand that the telemedicine visit will involve communicating with the Practitioner through live audiovisual communication technology and the disclosure of certain medical information by electronic transmission. I acknowledge that I have been given the opportunity to request an in-person assessment or other available alternative prior to the telemedicine visit and am voluntarily participating in the telemedicine visit.  I understand that I have the right to withhold or withdraw my consent to the use of telemedicine in the course of my care at any time, without  affecting my right to future care or treatment, and that the Practitioner or I may terminate the telemedicine visit at any time. I understand that I have the right to inspect all information obtained and/or recorded in the course of the telemedicine visit and may receive copies of available information for a reasonable fee.  I understand that some of the potential risks of receiving the Services via telemedicine include:  Marland Kitchen. Delay or interruption in medical evaluation due to technological equipment failure or disruption; . Information transmitted may not be sufficient (e.g. poor resolution of images) to allow for appropriate medical decision making by the Practitioner; and/or  . In rare instances, security protocols could fail, causing a breach of personal health information.  Furthermore, I acknowledge that it is my responsibility to provide information about my medical history, conditions and care that is complete and accurate to the best of my ability. I acknowledge that Practitioner's advice, recommendations, and/or decision may be based on factors not within their control, such as incomplete or inaccurate data provided by me or distortions of diagnostic images or  specimens that may result from electronic transmissions. I understand that the practice of medicine is not an exact science and that Practitioner makes no warranties or guarantees regarding treatment outcomes. I acknowledge that I will receive a copy of this consent concurrently upon execution via email to the email address I last provided but may also request a printed copy by calling the office of [facility].    I understand that my insurance will be billed for this visit.   I have read or had this consent read to me. . I understand the contents of this consent, which adequately explains the benefits and risks of the Services being provided via telemedicine.  . I have been provided ample opportunity to ask questions regarding this  consent and the Services and have had my questions answered to my satisfaction. . I give my informed consent for the services to be provided through the use of telemedicine in my medical care  By participating in this telemedicine visit I agree to the above.

## 2019-03-06 NOTE — Progress Notes (Signed)
Remote pacemaker transmission.   

## 2019-05-29 ENCOUNTER — Ambulatory Visit (INDEPENDENT_AMBULATORY_CARE_PROVIDER_SITE_OTHER): Payer: Medicare Other | Admitting: *Deleted

## 2019-05-29 DIAGNOSIS — I441 Atrioventricular block, second degree: Secondary | ICD-10-CM

## 2019-05-29 LAB — CUP PACEART REMOTE DEVICE CHECK
Battery Remaining Longevity: 143 mo
Battery Voltage: 3.1 V
Brady Statistic AP VP Percent: 9.47 %
Brady Statistic AP VS Percent: 0.26 %
Brady Statistic AS VP Percent: 88.96 %
Brady Statistic AS VS Percent: 1.32 %
Brady Statistic RA Percent Paced: 9.94 %
Brady Statistic RV Percent Paced: 98.42 %
Date Time Interrogation Session: 20200903020045
Implantable Lead Implant Date: 20200228
Implantable Lead Implant Date: 20200228
Implantable Lead Location: 753858
Implantable Lead Location: 753859
Implantable Lead Model: 5076
Implantable Lead Model: 5076
Implantable Pulse Generator Implant Date: 20200228
Lead Channel Impedance Value: 304 Ohm
Lead Channel Impedance Value: 361 Ohm
Lead Channel Impedance Value: 399 Ohm
Lead Channel Impedance Value: 475 Ohm
Lead Channel Pacing Threshold Amplitude: 0.5 V
Lead Channel Pacing Threshold Amplitude: 0.5 V
Lead Channel Pacing Threshold Pulse Width: 0.4 ms
Lead Channel Pacing Threshold Pulse Width: 0.4 ms
Lead Channel Sensing Intrinsic Amplitude: 3.25 mV
Lead Channel Sensing Intrinsic Amplitude: 3.25 mV
Lead Channel Sensing Intrinsic Amplitude: 6.25 mV
Lead Channel Sensing Intrinsic Amplitude: 6.25 mV
Lead Channel Setting Pacing Amplitude: 1.5 V
Lead Channel Setting Pacing Amplitude: 2 V
Lead Channel Setting Pacing Pulse Width: 0.4 ms
Lead Channel Setting Sensing Sensitivity: 2.8 mV

## 2019-05-30 ENCOUNTER — Other Ambulatory Visit: Payer: Self-pay | Admitting: Family Medicine

## 2019-05-30 ENCOUNTER — Other Ambulatory Visit: Payer: Self-pay | Admitting: Physician Assistant

## 2019-05-30 DIAGNOSIS — Z1231 Encounter for screening mammogram for malignant neoplasm of breast: Secondary | ICD-10-CM

## 2019-06-11 ENCOUNTER — Encounter: Payer: Self-pay | Admitting: Cardiology

## 2019-06-11 NOTE — Progress Notes (Signed)
Remote pacemaker transmission.   

## 2019-07-14 ENCOUNTER — Ambulatory Visit
Admission: RE | Admit: 2019-07-14 | Discharge: 2019-07-14 | Disposition: A | Discharge status: Home / Self Care | Payer: Medicare Other | Source: Ambulatory Visit | Attending: Physician Assistant | Admitting: Physician Assistant

## 2019-07-14 ENCOUNTER — Other Ambulatory Visit: Payer: Self-pay

## 2019-07-14 DIAGNOSIS — Z1231 Encounter for screening mammogram for malignant neoplasm of breast: Secondary | ICD-10-CM

## 2019-07-28 ENCOUNTER — Other Ambulatory Visit: Payer: Self-pay

## 2019-07-28 DIAGNOSIS — Z20822 Contact with and (suspected) exposure to covid-19: Secondary | ICD-10-CM

## 2019-07-29 LAB — NOVEL CORONAVIRUS, NAA: SARS-CoV-2, NAA: NOT DETECTED

## 2019-08-28 ENCOUNTER — Ambulatory Visit (INDEPENDENT_AMBULATORY_CARE_PROVIDER_SITE_OTHER): Payer: Medicare Other | Admitting: *Deleted

## 2019-08-28 DIAGNOSIS — I459 Conduction disorder, unspecified: Secondary | ICD-10-CM

## 2019-08-28 LAB — CUP PACEART REMOTE DEVICE CHECK
Battery Remaining Longevity: 138 mo
Battery Voltage: 3.06 V
Brady Statistic AP VP Percent: 11.89 %
Brady Statistic AP VS Percent: 0.62 %
Brady Statistic AS VP Percent: 84.45 %
Brady Statistic AS VS Percent: 3.04 %
Brady Statistic RA Percent Paced: 12.83 %
Brady Statistic RV Percent Paced: 96.34 %
Date Time Interrogation Session: 20201202210216
Implantable Lead Implant Date: 20200228
Implantable Lead Implant Date: 20200228
Implantable Lead Location: 753858
Implantable Lead Location: 753859
Implantable Lead Model: 5076
Implantable Lead Model: 5076
Implantable Pulse Generator Implant Date: 20200228
Lead Channel Impedance Value: 304 Ohm
Lead Channel Impedance Value: 342 Ohm
Lead Channel Impedance Value: 380 Ohm
Lead Channel Impedance Value: 437 Ohm
Lead Channel Pacing Threshold Amplitude: 0.5 V
Lead Channel Pacing Threshold Amplitude: 0.625 V
Lead Channel Pacing Threshold Pulse Width: 0.4 ms
Lead Channel Pacing Threshold Pulse Width: 0.4 ms
Lead Channel Sensing Intrinsic Amplitude: 3.375 mV
Lead Channel Sensing Intrinsic Amplitude: 3.375 mV
Lead Channel Sensing Intrinsic Amplitude: 6.25 mV
Lead Channel Sensing Intrinsic Amplitude: 6.25 mV
Lead Channel Setting Pacing Amplitude: 1.5 V
Lead Channel Setting Pacing Amplitude: 2 V
Lead Channel Setting Pacing Pulse Width: 0.4 ms
Lead Channel Setting Sensing Sensitivity: 2.8 mV

## 2019-09-23 NOTE — Progress Notes (Signed)
PPM remote 

## 2019-10-17 ENCOUNTER — Telehealth: Payer: Self-pay | Admitting: Cardiology

## 2019-10-17 NOTE — Telephone Encounter (Signed)
STAT if HR is under 50 or over 120 (normal HR is 60-100 beats per minute)  1) What is your heart rate?right  Now it is107, 97 ,earlier it was 56, 63   2) Do you have a log of your heart rate readings (document readings)? no  3) Do you have any other symptoms? No not right now, felt  lightheaded earlier- Pt wanted you to know she took the Vaccine on Wednesday

## 2019-10-17 NOTE — Telephone Encounter (Signed)
Spoke to pt. Spoke to device clinic. Pt aware her PPM lower limit is 60 and upper limit is 130. Advised to continue to monitor and she is welcome to send in a transmission for [place] to review if she would like. Pt agreeable to plan.

## 2019-11-24 ENCOUNTER — Ambulatory Visit: Payer: Medicare PPO | Admitting: Cardiology

## 2019-11-24 ENCOUNTER — Other Ambulatory Visit: Payer: Self-pay

## 2019-11-24 ENCOUNTER — Encounter: Payer: Self-pay | Admitting: Cardiology

## 2019-11-24 VITALS — BP 126/78 | HR 87 | Ht 65.0 in | Wt 147.0 lb

## 2019-11-24 DIAGNOSIS — I441 Atrioventricular block, second degree: Secondary | ICD-10-CM | POA: Diagnosis not present

## 2019-11-24 DIAGNOSIS — R002 Palpitations: Secondary | ICD-10-CM

## 2019-11-24 DIAGNOSIS — Z79899 Other long term (current) drug therapy: Secondary | ICD-10-CM | POA: Diagnosis not present

## 2019-11-24 LAB — CUP PACEART INCLINIC DEVICE CHECK
Battery Remaining Longevity: 126 mo
Battery Voltage: 3.04 V
Brady Statistic AP VP Percent: 9 %
Brady Statistic AP VS Percent: 0.46 %
Brady Statistic AS VP Percent: 88.58 %
Brady Statistic AS VS Percent: 1.95 %
Brady Statistic RA Percent Paced: 9.66 %
Brady Statistic RV Percent Paced: 97.58 %
Date Time Interrogation Session: 20210301150600
Implantable Lead Implant Date: 20200228
Implantable Lead Implant Date: 20200228
Implantable Lead Location: 753858
Implantable Lead Location: 753859
Implantable Lead Model: 5076
Implantable Lead Model: 5076
Implantable Pulse Generator Implant Date: 20200228
Lead Channel Impedance Value: 342 Ohm
Lead Channel Impedance Value: 380 Ohm
Lead Channel Impedance Value: 475 Ohm
Lead Channel Impedance Value: 551 Ohm
Lead Channel Pacing Threshold Amplitude: 0.5 V
Lead Channel Pacing Threshold Amplitude: 0.625 V
Lead Channel Pacing Threshold Pulse Width: 0.4 ms
Lead Channel Pacing Threshold Pulse Width: 0.4 ms
Lead Channel Sensing Intrinsic Amplitude: 0.625 mV
Lead Channel Sensing Intrinsic Amplitude: 5 mV
Lead Channel Sensing Intrinsic Amplitude: 7.25 mV
Lead Channel Sensing Intrinsic Amplitude: 9 mV
Lead Channel Setting Pacing Amplitude: 2 V
Lead Channel Setting Pacing Amplitude: 2.5 V
Lead Channel Setting Pacing Pulse Width: 0.4 ms
Lead Channel Setting Sensing Sensitivity: 2.8 mV

## 2019-11-24 NOTE — Progress Notes (Signed)
Electrophysiology Office Note   Date:  11/24/2019   ID:  Donna Brady, DOB 1951/01/01, MRN [id number]  PCP:  Mitzi Hansen, NP  Cardiologist:  Antoine Poche Primary Electrophysiologist:  Stuti Sandin Jorja Loa, MD    Chief Complaint: heart block   History of Present Illness: Donna Brady is a 69 y.o. female who is being seen today for the evaluation of heart block at the request of Mitzi Hansen, NP. Presenting today for electrophysiology evaluation.  She has a history significant for mitral regurgitation with mitral valve prolapse.  She presented to the hospital February 2020 with advanced heart block.  She is now status post Medtronic dual-chamber pacemaker 11/22/2018.  She required RV lead revision due to loss of capture.  Today, she denies symptoms of chest pain, shortness of breath, orthopnea, PND, lower extremity edema, claudication, dizziness, presyncope, syncope, bleeding, or neurologic sequela. The patient is tolerating medications without difficulties.  Over the past few months she has had episodic palpitations.  She says that she wakes up from sleep and her heart rate is in the 120s.  She gets up, has some Tylenol, drink some cold water and eventually gets better.  Device interrogation shows no major abnormalities other than some nonsustained ventricular tachycardia.  Unfortunately this is below her mode switch level.   Past Medical History:  Diagnosis Date  . MR (congenital mitral regurgitation)   . MVP (mitral valve prolapse)   . Nephrolithiasis   . Pacemaker lead malfunction due to dislodgement with removal of lead and new lead placed 11/23/18 11/24/2018  . S/P placement of cardiac pacemaker 11/22/18 MDT  11/23/2018   Past Surgical History:  Procedure Laterality Date  . BREAST BIOPSY Right 2014  . CESAREAN SECTION     3  . PACEMAKER IMPLANT N/A 11/22/2018   Procedure: PACEMAKER IMPLANT;  Surgeon: Regan Lemming, MD;  Location: [facility] INVASIVE CV LAB;  Service:  Cardiovascular;  Laterality: N/A;  . PACEMAKER REVISION N/A 11/23/2018   Procedure: PACEMAKER REVISION;  Surgeon: Duke Salvia, MD;  Location: [facility] INVASIVE CV LAB;  Service: Cardiovascular;  Laterality: N/A;  . TUBAL LIGATION       Current Outpatient Medications  Medication Sig Dispense Refill  . acetaminophen (TYLENOL) 325 MG tablet Take 650 mg by mouth every 6 (six) hours as needed for moderate pain or headache.    . Multiple Vitamin (MULTIVITAMIN) tablet Take 1 tablet by mouth daily.     No current facility-administered medications for this visit.    Allergies:   Adhesive [tape]   Social History:  The patient  reports that she has never smoked. She has never used smokeless tobacco.   Family History:  The patient's family history includes Cancer in her father; Dementia in her mother; Parkinson's disease in her father.    ROS:  Please see the history of present illness.   Otherwise, review of systems is positive for none.   All other systems are reviewed and negative.    PHYSICAL EXAM: VS:  BP 126/78   Pulse 87   Ht 5\' 5"  (1.651 m)   Wt 147 lb (66.7 kg)   SpO2 98%   BMI 24.46 kg/m  , BMI Body mass index is 24.46 kg/m. GEN: Well nourished, well developed, in no acute distress  HEENT: normal  Neck: no JVD, carotid bruits, or masses Cardiac: RRR; no murmurs, rubs, or gallops,no edema  Respiratory:  clear to auscultation bilaterally, normal work of breathing GI: soft, nontender, nondistended, + BS MS:  no deformity or atrophy  Skin: warm and dry, device pocket is well healed Neuro:  Strength and sensation are intact Psych: euthymic mood, full affect  EKG:  EKG is ordered today. Personal review of the ekg ordered shows sinus rhythm, rate 87  Device interrogation is reviewed today in detail.  See PaceArt for details.   Recent Labs: No results found for requested labs within last 8760 hours.    Lipid Panel  No results found for: CHOL, TRIG, HDL, CHOLHDL, VLDL,  LDLCALC, LDLDIRECT   Wt Readings from Last 3 Encounters:  11/24/19 147 lb (66.7 kg)  11/23/18 151 lb 4.8 oz (68.6 kg)  11/10/14 150 lb (68 kg)      Other studies Reviewed: Additional studies/ records that were reviewed today include: TTE 11/22/18  Review of the above records today demonstrates:  1. The left ventricle has normal systolic function, with an ejection  fraction of 55-60%. The cavity size was normal. There is mildly increased  left ventricular wall thickness. Left ventricular diastolic Doppler  parameters are indeterminate.  2. The right ventricle has normal systolic function. The cavity was  normal. There is no increase in right ventricular wall thickness.  3. Systolic bowing without prolapse. Mild thickening of the mitral valve  leaflet.  4. The tricuspid valve is normal in structure.  5. The aortic valve is tricuspid Mild sclerosis of the aortic valve.  6. The aortic root and ascending aorta are normal in size and structure.  7. The inferior vena cava was dilated in size with >50% respiratory  variability.    ASSESSMENT AND PLAN:  1.  Second-degree AV block: Status post Medtronic dual-chamber pacemaker implanted 11/22/2018.  Device functioning appropriately.  No changes at this time.  2.  Palpitations: At this point is unclear to me as to the cause of her palpitations.  She has not had labs checked in a while.  This could be a groin issue.  Basic metabolic, magnesium, and TSH to ensure that she is not having any thyroid issues causing these palpitations.  She does have resting tachycardia which could be due to thyroid issues.  Current medicines are reviewed at length with the patient today.   The patient does not have concerns regarding her medicines.  The following changes were made today:  none  Labs/ tests ordered today include:  Orders Placed This Encounter  Procedures  . TSH  . Magnesium  . Basic metabolic panel  . EKG 12-Lead     Disposition:    FU with Tip Atienza 1 year  Signed, Furkan Keenum Meredith Leeds, MD  11/24/2019 3:35 PM     [place] [address] [place] [address] [phone] (office) [phone] (fax)

## 2019-11-24 NOTE — Patient Instructions (Addendum)
Medication Instructions:  Your physician recommends that you continue on your current medications as directed. Please refer to the Current Medication list given to you today.  *If you need a refill on your cardiac medications before your next appointment, please call your pharmacy*   Lab Work: Today: TSH, Magnesium level & BMET If you have labs (blood work) drawn today and your tests are completely normal, you will receive your results only by: Marland Kitchen MyChart Message (if you have MyChart) OR . A paper copy in the mail If you have any lab test that is abnormal or we need to change your treatment, we will call you to review the results.   Testing/Procedures: None ordered  Follow-Up: Remote monitoring is used to monitor your Pacemaker of ICD from home. This monitoring reduces the number of office visits required to check your device to one time per year. It allows [place] to keep an eye on the functioning of your device to ensure it is working properly. You are scheduled for a device check from home on 11/27/19, 02/26/20. You may send your transmission at any time that day. If you have a wireless device, the transmission will be sent automatically. After your physician reviews your transmission, you will receive a postcard with your next transmission date.   At [facility], you and your health needs are our priority.  As part of our continuing mission to provide you with exceptional heart care, we have created designated Provider Care Teams.  These Care Teams include your primary Cardiologist (physician) and Advanced Practice Providers (APPs -  Physician Assistants and Nurse Practitioners) who all work together to provide you with the care you need, when you need it.  Your next appointment:   1 year(s)  The format for your next appointment:   In Person  Provider:   Loman Brooklyn, MD   Thank you for choosing [facility]!!   Dory Horn, RN [phone]

## 2019-11-25 LAB — BASIC METABOLIC PANEL
BUN/Creatinine Ratio: 16 (ref 12–28)
BUN: 12 mg/dL (ref 8–27)
CO2: 25 mmol/L (ref 20–29)
Calcium: 10 mg/dL (ref 8.7–10.3)
Chloride: 104 mmol/L (ref 96–106)
Creatinine, Ser: 0.77 mg/dL (ref 0.57–1.00)
GFR calc Af Amer: 91 mL/min/{1.73_m2} (ref >59)
GFR calc non Af Amer: 79 mL/min/{1.73_m2} (ref >59)
Glucose: 112 mg/dL — ABNORMAL HIGH (ref 65–99)
Potassium: 4.1 mmol/L (ref 3.5–5.2)
Sodium: 143 mmol/L (ref 134–144)

## 2019-11-25 LAB — TSH: TSH: 1.6 u[IU]/mL (ref 0.450–4.500)

## 2019-11-25 LAB — MAGNESIUM: Magnesium: 2.1 mg/dL (ref 1.6–2.3)

## 2019-11-26 ENCOUNTER — Telehealth: Payer: Self-pay

## 2019-11-26 NOTE — Telephone Encounter (Signed)
The patient has been notified of the result and verbalized understanding.  All questions (if any) were answered. Leanord Hawking, RN 11/26/2019 3:47 PM

## 2019-11-26 NOTE — Telephone Encounter (Signed)
-----   Message from Will Jorja Loa, MD sent at 11/26/2019  2:19 PM EST ----- Stable labs If palpitations continue, possibly start metoprolol.

## 2019-11-27 ENCOUNTER — Ambulatory Visit (INDEPENDENT_AMBULATORY_CARE_PROVIDER_SITE_OTHER): Payer: Medicare PPO | Admitting: *Deleted

## 2019-11-27 DIAGNOSIS — I441 Atrioventricular block, second degree: Secondary | ICD-10-CM | POA: Diagnosis not present

## 2019-11-27 LAB — CUP PACEART REMOTE DEVICE CHECK
Battery Remaining Longevity: 124 mo
Battery Voltage: 3.04 V
Brady Statistic AP VP Percent: 0.2 %
Brady Statistic AP VS Percent: 4.88 %
Brady Statistic AS VP Percent: 28.91 %
Brady Statistic AS VS Percent: 66.01 %
Brady Statistic RA Percent Paced: 5.22 %
Brady Statistic RV Percent Paced: 29.11 %
Date Time Interrogation Session: 20210303205926
Implantable Lead Implant Date: 20200228
Implantable Lead Implant Date: 20200228
Implantable Lead Location: 753858
Implantable Lead Location: 753859
Implantable Lead Model: 5076
Implantable Lead Model: 5076
Implantable Pulse Generator Implant Date: 20200228
Lead Channel Impedance Value: 323 Ohm
Lead Channel Impedance Value: 361 Ohm
Lead Channel Impedance Value: 437 Ohm
Lead Channel Impedance Value: 494 Ohm
Lead Channel Pacing Threshold Amplitude: 0.5 V
Lead Channel Pacing Threshold Amplitude: 0.5 V
Lead Channel Pacing Threshold Pulse Width: 0.4 ms
Lead Channel Pacing Threshold Pulse Width: 0.4 ms
Lead Channel Sensing Intrinsic Amplitude: 4.125 mV
Lead Channel Sensing Intrinsic Amplitude: 4.125 mV
Lead Channel Sensing Intrinsic Amplitude: 7 mV
Lead Channel Sensing Intrinsic Amplitude: 7 mV
Lead Channel Setting Pacing Amplitude: 2 V
Lead Channel Setting Pacing Amplitude: 2.5 V
Lead Channel Setting Pacing Pulse Width: 0.4 ms
Lead Channel Setting Sensing Sensitivity: 2.8 mV

## 2019-11-27 NOTE — Progress Notes (Signed)
PPM Remote  

## 2019-12-16 ENCOUNTER — Encounter (HOSPITAL_COMMUNITY): Payer: Self-pay | Admitting: Emergency Medicine

## 2019-12-16 ENCOUNTER — Emergency Department (HOSPITAL_COMMUNITY): Payer: Medicare PPO

## 2019-12-16 ENCOUNTER — Emergency Department (HOSPITAL_COMMUNITY)
Admission: EM | Admit: 2019-12-16 | Discharge: 2019-12-17 | Disposition: A | Discharge status: Home / Self Care | Payer: Medicare PPO | Attending: Emergency Medicine | Admitting: Emergency Medicine

## 2019-12-16 ENCOUNTER — Other Ambulatory Visit: Payer: Self-pay

## 2019-12-16 DIAGNOSIS — Z950 Presence of cardiac pacemaker: Secondary | ICD-10-CM | POA: Diagnosis not present

## 2019-12-16 DIAGNOSIS — Z79899 Other long term (current) drug therapy: Secondary | ICD-10-CM | POA: Insufficient documentation

## 2019-12-16 DIAGNOSIS — R202 Paresthesia of skin: Secondary | ICD-10-CM | POA: Diagnosis not present

## 2019-12-16 DIAGNOSIS — R002 Palpitations: Secondary | ICD-10-CM | POA: Diagnosis present

## 2019-12-16 DIAGNOSIS — F419 Anxiety disorder, unspecified: Secondary | ICD-10-CM

## 2019-12-16 LAB — CBC
HCT: 44 % (ref 36.0–46.0)
Hemoglobin: 14 g/dL (ref 12.0–15.0)
MCH: 29.3 pg (ref 26.0–34.0)
MCHC: 31.8 g/dL (ref 30.0–36.0)
MCV: 92.1 fL (ref 80.0–100.0)
Platelets: 218 10*3/uL (ref 150–400)
RBC: 4.78 MIL/uL (ref 3.87–5.11)
RDW: 12.6 % (ref 11.5–15.5)
WBC: 7.6 10*3/uL (ref 4.0–10.5)
nRBC: 0 % (ref 0.0–0.2)

## 2019-12-16 LAB — BASIC METABOLIC PANEL
Anion gap: 13 (ref 5–15)
BUN: 12 mg/dL (ref 8–23)
CO2: 24 mmol/L (ref 22–32)
Calcium: 9.7 mg/dL (ref 8.9–10.3)
Chloride: 106 mmol/L (ref 98–111)
Creatinine, Ser: 0.65 mg/dL (ref 0.44–1.00)
GFR calc Af Amer: >60 mL/min (ref >60)
GFR calc non Af Amer: >60 mL/min (ref >60)
Glucose, Bld: 106 mg/dL — ABNORMAL HIGH (ref 70–99)
Potassium: 3.9 mmol/L (ref 3.5–5.1)
Sodium: 143 mmol/L (ref 135–145)

## 2019-12-16 LAB — TROPONIN I (HIGH SENSITIVITY)
Troponin I (High Sensitivity): 4 ng/L (ref <18)
Troponin I (High Sensitivity): 5 ng/L (ref <18)

## 2019-12-16 MED ORDER — SODIUM CHLORIDE 0.9% FLUSH: 3.0000 mL | Freq: Once | INTRAVENOUS | Status: DC

## 2019-12-16 NOTE — ED Provider Notes (Signed)
[place] EMERGENCY DEPARTMENT Provider Note   CSN: [id number] Arrival date & time: 12/16/19  1656     History Chief Complaint  Patient presents with  . Weakness  . Palpitations    Donna Brady is a 69 y.o. female with a h/o of Mobitz Type 2 AV block s/o metronic pacemaker in 2020 who presents to the emergency department with a chief complaint of feeling anxious.  The patient reports that she was in her kitchen making food when she suddenly felt "off" and very anxious.  She also developed some tingling in her bilateral lower legs up to the level of her knees.  No paresthesias in her upper extremities.  The episode lasted for approximately 30 minutes before resolving spontaneously.  She states that she has had short periods of time in the past where she is felt anxious before, but has never had paresthesias in her legs or a feeling "that something was wrong".   No chest pain, shortness of breath, dizziness, lightheadedness, numbness, weakness, near syncope, nausea, vomiting, abdominal pain, back or neck pain, or visual changes.  She reports that she had her pacemaker settings changed to demand on March 1.  Reports that she was making food for an upcoming funeral on Friday for a close friend who was like a second mother to her husband, who is also deceased.  The history is provided by the patient. No language interpreter was used.       Past Medical History:  Diagnosis Date  . MR (congenital mitral regurgitation)   . MVP (mitral valve prolapse)   . Nephrolithiasis   . Pacemaker lead malfunction due to dislodgement with removal of lead and new lead placed 11/23/18 11/24/2018  . S/P placement of cardiac pacemaker 11/22/18 MDT  11/23/2018    Patient Active Problem List   Diagnosis Date Noted  . Pacemaker lead malfunction due to dislodgement with removal of lead and new lead placed 11/23/18 11/24/2018  . S/P placement of cardiac pacemaker 11/22/18 MDT  11/23/2018   . AV block, Mobitz 2 11/21/2018  . Heart block 11/21/2018  . Mitral regurgitation 11/10/2014  . MVP (mitral valve prolapse) 11/10/2014  . PVC's (premature ventricular contractions) 11/10/2014    Past Surgical History:  Procedure Laterality Date  . BREAST BIOPSY Right 2014  . CESAREAN SECTION     3  . PACEMAKER IMPLANT N/A 11/22/2018   Procedure: PACEMAKER IMPLANT;  Surgeon: Constance Haw, MD;  Location: [facility] CV LAB;  Service: Cardiovascular;  Laterality: N/A;  . PACEMAKER REVISION N/A 11/23/2018   Procedure: PACEMAKER REVISION;  Surgeon: Deboraha Sprang, MD;  Location: [facility] CV LAB;  Service: Cardiovascular;  Laterality: N/A;  . TUBAL LIGATION       OB History   No obstetric history on file.     Family History  Problem Relation Age of Onset  . Cancer Father   . Parkinson's disease Father   . Dementia Mother     Social History   Tobacco Use  . Smoking status: Never Smoker  . Smokeless tobacco: Never Used  Substance Use Topics  . Alcohol use: Not on file  . Drug use: Not on file    Home Medications Prior to Admission medications   Medication Sig Start Date End Date Taking? Authorizing Provider  acetaminophen (TYLENOL) 325 MG tablet Take 650 mg by mouth every 6 (six) hours as needed for moderate pain or headache.    [provider]  Multiple Vitamin (  MULTIVITAMIN) tablet Take 1 tablet by mouth daily.    [provider]    Allergies    Adhesive [tape]  Review of Systems   Review of Systems  Constitutional: Negative for activity change, chills and fever.  HENT: Negative for congestion and sore throat.   Eyes: Negative for visual disturbance.  Respiratory: Negative for shortness of breath.   Cardiovascular: Negative for chest pain.  Gastrointestinal: Negative for abdominal pain, diarrhea, nausea and vomiting.  Genitourinary: Negative for dysuria.  Musculoskeletal: Negative for back pain and myalgias.  Skin: Negative for  rash.  Allergic/Immunologic: Negative for immunocompromised state.  Neurological: Negative for dizziness, seizures, syncope, weakness, light-headedness, numbness and headaches.       Paresthesias  Psychiatric/Behavioral: Negative for confusion. The patient is nervous/anxious.     Physical Exam Updated Vital Signs BP 122/68   Pulse 73   Temp 99.3 F (37.4 C) (Oral)   Resp 20   Ht 5\' 5"  (1.651 m)   Wt 64.9 kg   SpO2 97%   BMI 23.80 kg/m   Physical Exam Vitals and nursing note reviewed.  Constitutional:      General: She is not in acute distress.    Appearance: She is not ill-appearing, toxic-appearing or diaphoretic.     Comments: Pleasant female in no acute distress.  HENT:     Head: Normocephalic.  Eyes:     Conjunctiva/sclera: Conjunctivae normal.  Cardiovascular:     Rate and Rhythm: Normal rate and regular rhythm.     Pulses: Normal pulses.     Heart sounds: Normal heart sounds. No murmur. No friction rub. No gallop.   Pulmonary:     Effort: Pulmonary effort is normal. No respiratory distress.     Breath sounds: Normal breath sounds. No stridor. No wheezing, rhonchi or rales.  Chest:     Chest wall: No tenderness.  Abdominal:     General: There is no distension.     Palpations: Abdomen is soft.  Musculoskeletal:     Cervical back: Neck supple.     Right lower leg: No edema.     Left lower leg: No edema.  Skin:    General: Skin is warm.     Findings: No rash.  Neurological:     Mental Status: She is alert.     Comments: Neurovascularly intact  Psychiatric:        Behavior: Behavior normal.     ED Results / Procedures / Treatments   Labs (all labs ordered are listed, but only abnormal results are displayed) Labs Reviewed  BASIC METABOLIC PANEL - Abnormal; Notable for the following components:      Result Value   Glucose, Bld 106 (*)    All other components within normal limits  CBC  TROPONIN I (HIGH SENSITIVITY)  TROPONIN I (HIGH SENSITIVITY)     EKG EKG Interpretation  Date/Time:  Tuesday December 16 2019 17:08:06 EDT Ventricular Rate:  73 PR Interval:  138 QRS Duration: 116 QT Interval:  390 QTC Calculation: 429 R Axis:   86 Text Interpretation: Normal sinus rhythm Incomplete right bundle branch block Borderline ECG no pacer spikes seen c/w prior Confirmed by 11-15-1979 (Eber Hong) on 12/16/2019 10:57:40 PM   Radiology DG Chest 2 View  Result Date: 12/16/2019 CLINICAL DATA:  Pt arrives to ED from home with [facility] with complaints of sudden palpitations, anxiety, and weakness for about 30 minutes. Patient states that she felt "off" and felt like "something was not right." Patient  denies CP and SOB. EXAM: CHEST - 2 VIEW COMPARISON:  11/25/2018 FINDINGS: Dual lead pacer in place. The lungs appear clear.  Cardiac and mediastinal contours normal. No pleural effusion identified. No blunting of the costophrenic angles. IMPRESSION: No acute findings. Electronically Signed   By: Gaylyn Rong M.D.   On: 12/16/2019 18:21    Procedures Procedures (including critical care time)  Medications Ordered in ED Medications - No data to display  ED Course  I have reviewed the triage vital signs and the nursing notes.  Pertinent labs & imaging results that were available during my care of the patient were reviewed by me and considered in my medical decision making (see chart for details).    MDM Rules/Calculators/A&P                      69 year old female with a h/o of Mobitz Type 2 AV block s/o metronic pacemaker in 2020 presenting after a 30-minute episode of feeling anxious and "off".  She has never been diagnosed with anxiety.  Reports that her pacemaker settings were changed to demand on March 1.  Vitals are normal.  Chest x-ray is unremarkable.  Delta troponin is flat.  Labs are otherwise unremarkable.  EKG with incomplete right bundle branch block and no pacer spikes seen and sinus rhythm.  Doubt ACS, CVA, PE, aortic dissection,  arrhythmias, or sepsis.  Will interrogate pacemaker.  If pacemaker interrogation is unremarkable, will plan to discharge patient to home with cardiology follow-up.  Pacemaker interrogation is unremarkable. Will discharge patient home with outpatient cardiology follow-up. She has been reporting more episodes of feeling anxious. Will give referral to [facility]. ER return precautions given. She is hemodynamically stable and in no acute distress. Safe for discharge home with outpatient follow-up.  Final Clinical Impression(s) / ED Diagnoses Final diagnoses:  Anxiety  Paresthesia    Rx / DC Orders ED Discharge Orders    None       Barkley Boards, PA-C 12/17/19 0524    Eber Hong, MD 12/17/19 [phone]

## 2019-12-16 NOTE — Discharge Instructions (Addendum)
Thank you for allowing me to care for you today in the Emergency Department.   Your work-up today was reassuring.  However, it is still important to follow-up with cardiology.  Please call and schedule a follow-up appointment with Dr. Antoine Poche.  Let the office know when you are scheduling the appointment that you were scheduling a follow-up appointment after being seen in the ER.   If you continue to have episodes where you are feeling anxious after you are cleared by cardiology, you can follow-up with primary care to talk about management options.  [facility] also has a walk-in clinic and can see you for anxiety-like symptoms.  Return to the emergency department if you develop chest pain, shortness of breath, respiratory distress, new numbness or weakness, visual changes, if you pass out, or other new, concerning symptoms.

## 2019-12-16 NOTE — ED Triage Notes (Addendum)
Pt arrives to ED from home with [facility] with complaints of sudden palpitations, anxiety, and weakness for about 30 minutes. Patient states that she felt "off" and felt like "something was not right." Patient denies CP and SOB. Patient has demand pacemaker in place.

## 2019-12-16 NOTE — ED Notes (Signed)
Pt pacemaker interrogation completed. Pt's friend at bedside.

## 2019-12-16 NOTE — ED Notes (Signed)
Pt. Alert and oriented x4. 

## 2019-12-19 ENCOUNTER — Telehealth: Payer: Self-pay | Admitting: Cardiology

## 2019-12-19 NOTE — Telephone Encounter (Signed)
New message   Pt has questions about medication her PCP gave her and her heart. She would like a call about it. She said she can be reached before 12 or after 3.

## 2019-12-22 NOTE — Telephone Encounter (Signed)
Pt calling in to ask if ok to take Clonazepam with her pacemaker.  She was prescribed this last week, by her PCP, after visit to hospital with possible panic attack.   She wonders if she needs to take it every day.  Advised to further discuss w/ PCP. Aware it will not affect her PPM (which was her question/concern) She reports that occasionally she will wake up in middle of the night and can feel her heart racing.  She reports HRs 120s that will then drop to 40s -- info obtained from a pulse ox. She also believes recent life stressors contributed to event last week. Pt advised to discuss further w/ PCP on new medication.  Also advised to discuss HRs w/ Otilio Saber, PA at her appt this week.  Informed that her device will be checked to see if device parameters need to be changed. Pt agreeable to plan and appreciates my return call.

## 2019-12-23 NOTE — Progress Notes (Signed)
Electrophysiology Office Note Date: 12/24/2019  ID:  ROSENE PILLING, DOB 08/15/1951, MRN [id number]  PCP: Mitzi Hansen, NP Primary Cardiologist: Rollene Rotunda, MD Electrophysiologist: Will Jorja Loa, MD   CC: Pacemaker follow-up  JOURNII NIERMAN is a 69 y.o. female seen today for Dr. Elberta Fortis . she presents today for routine electrophysiology followup s/p ED visit for anxiety.  Since last being seen in our clinic, the patient reports doing well overall. She continues to have issues with anxiety. Occasionally she will feel palpitations and measure her HR up to 110-120s even at rest. This most recently occurred in the setting of high stress, preparing for a close friends funeral. she denies chest pain, dyspnea, PND, orthopnea, nausea, vomiting, dizziness, syncope, edema, weight gain, or early satiety.  Device History: Medtronic Dual Chamber PPM implanted 11/22/2018 for Advanced HB. S/p RV lead revision 11/24/2018 due to LOC.  Past Medical History:  Diagnosis Date  . MR (congenital mitral regurgitation)   . MVP (mitral valve prolapse)   . Nephrolithiasis   . Pacemaker lead malfunction due to dislodgement with removal of lead and new lead placed 11/23/18 11/24/2018  . S/P placement of cardiac pacemaker 11/22/18 MDT  11/23/2018   Past Surgical History:  Procedure Laterality Date  . BREAST BIOPSY Right 2014  . CESAREAN SECTION     3  . PACEMAKER IMPLANT N/A 11/22/2018   Procedure: PACEMAKER IMPLANT;  Surgeon: Regan Lemming, MD;  Location: [facility] INVASIVE CV LAB;  Service: Cardiovascular;  Laterality: N/A;  . PACEMAKER REVISION N/A 11/23/2018   Procedure: PACEMAKER REVISION;  Surgeon: Duke Salvia, MD;  Location: [facility] INVAS[facility] CV LAB;  Service: Cardiovascular;  Laterality: N/A;  . TUBAL LIGATION      Current Outpatient Medications  Medication Sig Dispense Refill  . acetaminophen (TYLENOL) 325 MG tablet Take 650 mg by mouth every 6 (six) hours as needed for moderate pain or  headache.    . Multiple Vitamin (MULTIVITAMIN) tablet Take 1 tablet by mouth daily.     No current facility-administered medications for this visit.    Allergies:   Adhesive [tape]   Social History: Social History   Socioeconomic History  . Marital status: Widowed    Spouse name: Not on file  . Number of children: 3  . Years of education: Not on file  . Highest education level: Not on file  Occupational History  . Not on file  Tobacco Use  . Smoking status: Never Smoker  . Smokeless tobacco: Never Used  Substance and Sexual Activity  . Alcohol use: Not on file  . Drug use: Not on file  . Sexual activity: Not on file  Other Topics Concern  . Not on file  Social History Narrative   Lives at home with husband.   Social Determinants of Health   Financial Resource Strain:   . Difficulty of Paying Living Expenses:   Food Insecurity:   . Worried About Programme researcher, broadcasting/film/video in the Last Year:   . Barista in the Last Year:   Transportation Needs:   . Freight forwarder (Medical):   Marland Kitchen Lack of Transportation (Non-Medical):   Physical Activity:   . Days of Exercise per Week:   . Minutes of Exercise per Session:   Stress:   . Feeling of Stress :   Social Connections:   . Frequency of Communication with Friends and Family:   . Frequency of Social Gatherings with Friends and Family:   .  Attends Religious Services:   . Active Member of Clubs or Organizations:   . Attends Archivist Meetings:   Marland Kitchen Marital Status:   Intimate Partner Violence:   . Fear of Current or Ex-Partner:   . Emotionally Abused:   Marland Kitchen Physically Abused:   . Sexually Abused:     Family History: Family History  Problem Relation Age of Onset  . Cancer Father   . Parkinson's disease Father   . Dementia Mother      Review of Systems: All other systems reviewed and are otherwise negative except as noted above.  Physical Exam: Vitals:   12/24/19 0856  BP: 114/68  Pulse: 90   SpO2: 97%  Weight: 146 lb 6.4 oz (66.4 kg)  Height: 5\' 5"  (1.651 m)     GEN- The patient is well appearing, alert and oriented x 3 today.   HEENT: normocephalic, atraumatic; sclera clear, conjunctiva pink; hearing intact; oropharynx clear; neck supple  Lungs- Clear to ausculation bilaterally, normal work of breathing.  No wheezes, rales, rhonchi Heart- Regular rate and rhythm, no murmurs, rubs or gallops  GI- soft, non-tender, non-distended, bowel sounds present  Extremities- no clubbing, cyanosis, or edema  MS- no significant deformity or atrophy Skin- warm and dry, no rash or lesion; PPM pocket well healed Psych- euthymic mood, full affect Neuro- strength and sensation are intact  PPM Interrogation- reviewed in detail today,  See PACEART report  EKG:  EKG is not ordered today.  Recent Labs: 11/24/2019: Magnesium 2.1; TSH 1.600 12/16/2019: BUN 12; Creatinine, Ser 0.65; Hemoglobin 14.0; Platelets 218; Potassium 3.9; Sodium 143   Wt Readings from Last 3 Encounters:  12/24/19 146 lb 6.4 oz (66.4 kg)  12/16/19 143 lb (64.9 kg)  11/24/19 147 lb (66.7 kg)     Other studies Reviewed: Additional studies/ records that were reviewed today include: Echo 11/22/2018 shows LVEF 55-60%, Previous EP office notes, Previous remote checks, Most recent labwork.   Assessment and Plan:  1. Advanced AV block s/p Medtronic PPM  Normal PPM function See Pace Art report No changes today  2. Palpitations Histograms appear appropriate. She does have HRs as high as 140 at times, but low percentage, and no way to differentiate if this is during periods of activity or not.    3. Anxiety This is limiting at times.  She states she was given an anti-anxiety medication to take every evening. I encouraged her to use this and follow up with her PCP based on results and for further.    Current medicines are reviewed at length with the patient today.   The patient does not have concerns regarding her  medicines.  The following changes were made today:  none  Labs/ tests ordered today include:  Orders Placed This Encounter  Procedures  . CUP PACEART INCLINIC DEVICE CHECK   Disposition:   Follow up with Dr. Curt Bears annually as scheduled for recall.     Jacalyn Lefevre, PA-C  12/24/2019 10:29 AM  [facility] [address] [place] [address] [phone] (office) [phone] (fax)

## 2019-12-24 ENCOUNTER — Ambulatory Visit: Payer: Medicare PPO | Admitting: Student

## 2019-12-24 ENCOUNTER — Other Ambulatory Visit: Payer: Self-pay

## 2019-12-24 ENCOUNTER — Encounter: Payer: Self-pay | Admitting: Student

## 2019-12-24 VITALS — BP 114/68 | HR 90 | Ht 65.0 in | Wt 146.4 lb

## 2019-12-24 DIAGNOSIS — F419 Anxiety disorder, unspecified: Secondary | ICD-10-CM | POA: Diagnosis not present

## 2019-12-24 DIAGNOSIS — I441 Atrioventricular block, second degree: Secondary | ICD-10-CM | POA: Diagnosis not present

## 2019-12-24 DIAGNOSIS — R002 Palpitations: Secondary | ICD-10-CM

## 2019-12-24 LAB — CUP PACEART INCLINIC DEVICE CHECK
Battery Remaining Longevity: 125 mo
Battery Voltage: 3.04 V
Brady Statistic AP VP Percent: 0.27 %
Brady Statistic AP VS Percent: 8.63 %
Brady Statistic AS VP Percent: 15.08 %
Brady Statistic AS VS Percent: 76.02 %
Brady Statistic RA Percent Paced: 9.28 %
Brady Statistic RV Percent Paced: 15.35 %
Date Time Interrogation Session: 20210331092159
Implantable Lead Implant Date: 20200228
Implantable Lead Implant Date: 20200228
Implantable Lead Location: 753858
Implantable Lead Location: 753859
Implantable Lead Model: 5076
Implantable Lead Model: 5076
Implantable Pulse Generator Implant Date: 20200228
Lead Channel Impedance Value: 323 Ohm
Lead Channel Impedance Value: 380 Ohm
Lead Channel Impedance Value: 437 Ohm
Lead Channel Impedance Value: 513 Ohm
Lead Channel Pacing Threshold Amplitude: 0.5 V
Lead Channel Pacing Threshold Amplitude: 0.625 V
Lead Channel Pacing Threshold Pulse Width: 0.4 ms
Lead Channel Pacing Threshold Pulse Width: 0.4 ms
Lead Channel Sensing Intrinsic Amplitude: 4 mV
Lead Channel Sensing Intrinsic Amplitude: 4.75 mV
Lead Channel Sensing Intrinsic Amplitude: 7 mV
Lead Channel Sensing Intrinsic Amplitude: 8 mV
Lead Channel Setting Pacing Amplitude: 2 V
Lead Channel Setting Pacing Amplitude: 2.5 V
Lead Channel Setting Pacing Pulse Width: 0.4 ms
Lead Channel Setting Sensing Sensitivity: 2.8 mV

## 2019-12-24 NOTE — Patient Instructions (Signed)
Medication Instructions:  none *If you need a refill on your cardiac medications before your next appointment, please call your pharmacy*   Lab Work: none If you have labs (blood work) drawn today and your tests are completely normal, you will receive your results only by: Marland Kitchen MyChart Message (if you have MyChart) OR . A paper copy in the mail If you have any lab test that is abnormal or we need to change your treatment, we will call you to review the results.   Testing/Procedures: none   Follow-Up: At [facility], you and your health needs are our priority.  As part of our continuing mission to provide you with exceptional heart care, we have created designated Provider Care Teams.  These Care Teams include your primary Cardiologist (physician) and Advanced Practice Providers (APPs -  Physician Assistants and Nurse Practitioners) who all work together to provide you with the care you need, when you need it.   Your next appointment:   1 year(s)  The format for your next appointment:   Either In Person or Virtual  Provider:   Dr Elberta Fortis   Other Instructions Remote monitoring is used to monitor your Pacemaker  from home. This monitoring reduces the number of office visits required to check your device to one time per year. It allows [place] to keep an eye on the functioning of your device to ensure it is working properly. You are scheduled for a device check from home on 02/26/20. You may send your transmission at any time that day. If you have a wireless device, the transmission will be sent automatically. After your physician reviews your transmission, you will receive a postcard with your next transmission date.

## 2020-02-26 ENCOUNTER — Ambulatory Visit (INDEPENDENT_AMBULATORY_CARE_PROVIDER_SITE_OTHER): Payer: Medicare PPO | Admitting: *Deleted

## 2020-02-26 DIAGNOSIS — I441 Atrioventricular block, second degree: Secondary | ICD-10-CM | POA: Diagnosis not present

## 2020-02-26 LAB — CUP PACEART REMOTE DEVICE CHECK
Battery Remaining Longevity: 127 mo
Battery Voltage: 3.04 V
Brady Statistic AP VP Percent: 0.03 %
Brady Statistic AP VS Percent: 8.55 %
Brady Statistic AS VP Percent: 1.34 %
Brady Statistic AS VS Percent: 90.08 %
Brady Statistic RA Percent Paced: 8.76 %
Brady Statistic RV Percent Paced: 1.37 %
Date Time Interrogation Session: 20210603051943
Implantable Lead Implant Date: 20200228
Implantable Lead Implant Date: 20200228
Implantable Lead Location: 753858
Implantable Lead Location: 753859
Implantable Lead Model: 5076
Implantable Lead Model: 5076
Implantable Pulse Generator Implant Date: 20200228
Lead Channel Impedance Value: 323 Ohm
Lead Channel Impedance Value: 380 Ohm
Lead Channel Impedance Value: 437 Ohm
Lead Channel Impedance Value: 494 Ohm
Lead Channel Pacing Threshold Amplitude: 0.5 V
Lead Channel Pacing Threshold Amplitude: 0.625 V
Lead Channel Pacing Threshold Pulse Width: 0.4 ms
Lead Channel Pacing Threshold Pulse Width: 0.4 ms
Lead Channel Sensing Intrinsic Amplitude: 4.5 mV
Lead Channel Sensing Intrinsic Amplitude: 4.5 mV
Lead Channel Sensing Intrinsic Amplitude: 6.75 mV
Lead Channel Sensing Intrinsic Amplitude: 6.75 mV
Lead Channel Setting Pacing Amplitude: 2 V
Lead Channel Setting Pacing Amplitude: 2.5 V
Lead Channel Setting Pacing Pulse Width: 0.4 ms
Lead Channel Setting Sensing Sensitivity: 2.8 mV

## 2020-03-01 ENCOUNTER — Telehealth: Payer: Self-pay

## 2020-03-01 NOTE — Telephone Encounter (Signed)
The pt states her pulse is high. It is at 116. When she took her shower it was in the 50's. I had her send a manual transmission for the nurse to review. I told her the nurse will give her a call back as soon as possible.

## 2020-03-01 NOTE — Telephone Encounter (Signed)
Patient had episode of a recorded HR of 127 bpm with a pulse oximeter after getting out of the shower. She rports she felt that her HR had increased, no CP, no SOB, no dizziness or syncope. She reports that she just did not feel "right" and knew her HR had increased. Transmission was sent and reviewed. Presenting EGM was a 1:1 ST with Max Hr of 111 bpm. Device function was WNL. No episodes recorded of atrial or ventricular arrhythmias. Patient reports no illness and reports her HR before the call was 80 bpm and she is asymptomatic. Patient will call if she has any s/sx or the tachycardia returns. ED precautions given for CP, SOB, syncope.

## 2020-03-02 NOTE — Progress Notes (Signed)
Remote pacemaker transmission.   

## 2020-03-29 IMAGING — DX DG CHEST 2V
2 series · 2 of 2 positions shown · non-contrast
Comparison: 11/23/2018; 11/21/2018

CLINICAL DATA: Post recent ICD placement

EXAM:
CHEST - 2 VIEW

[chest pa]
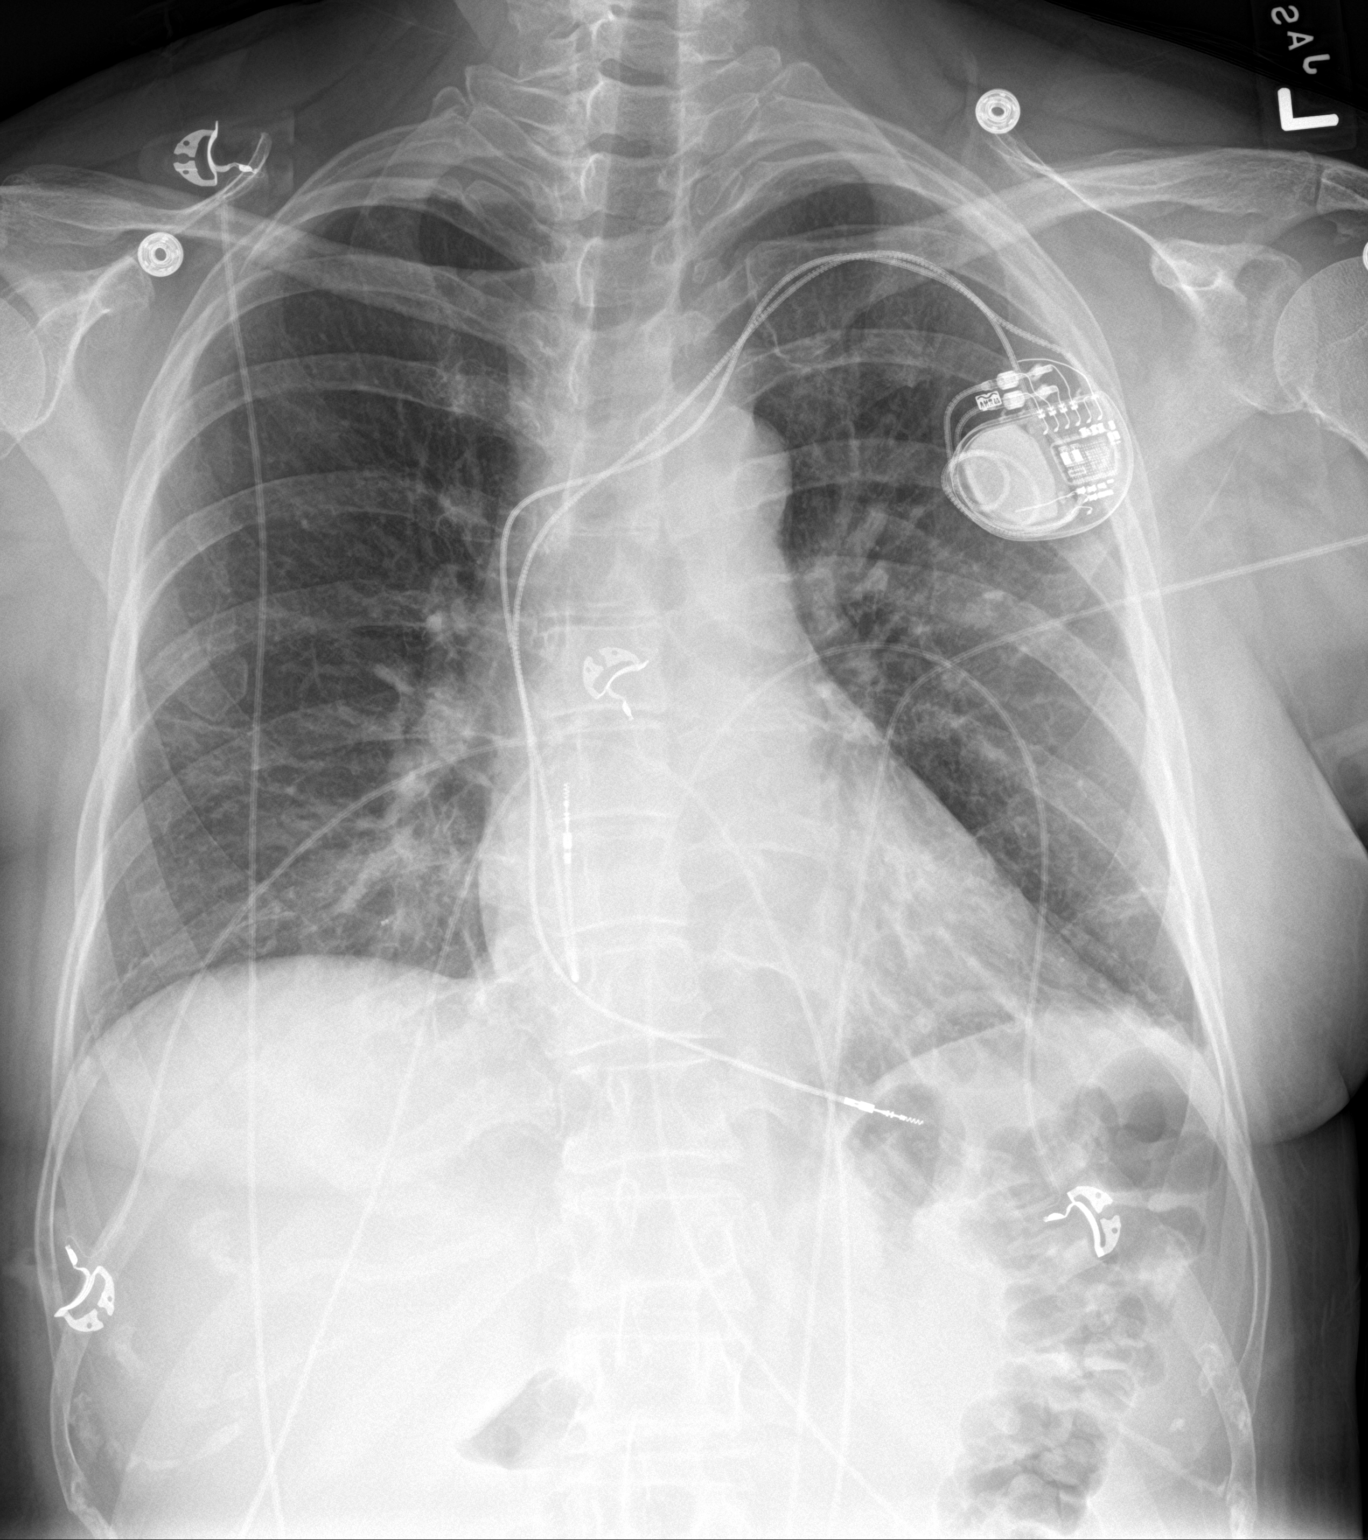

[chest lat]
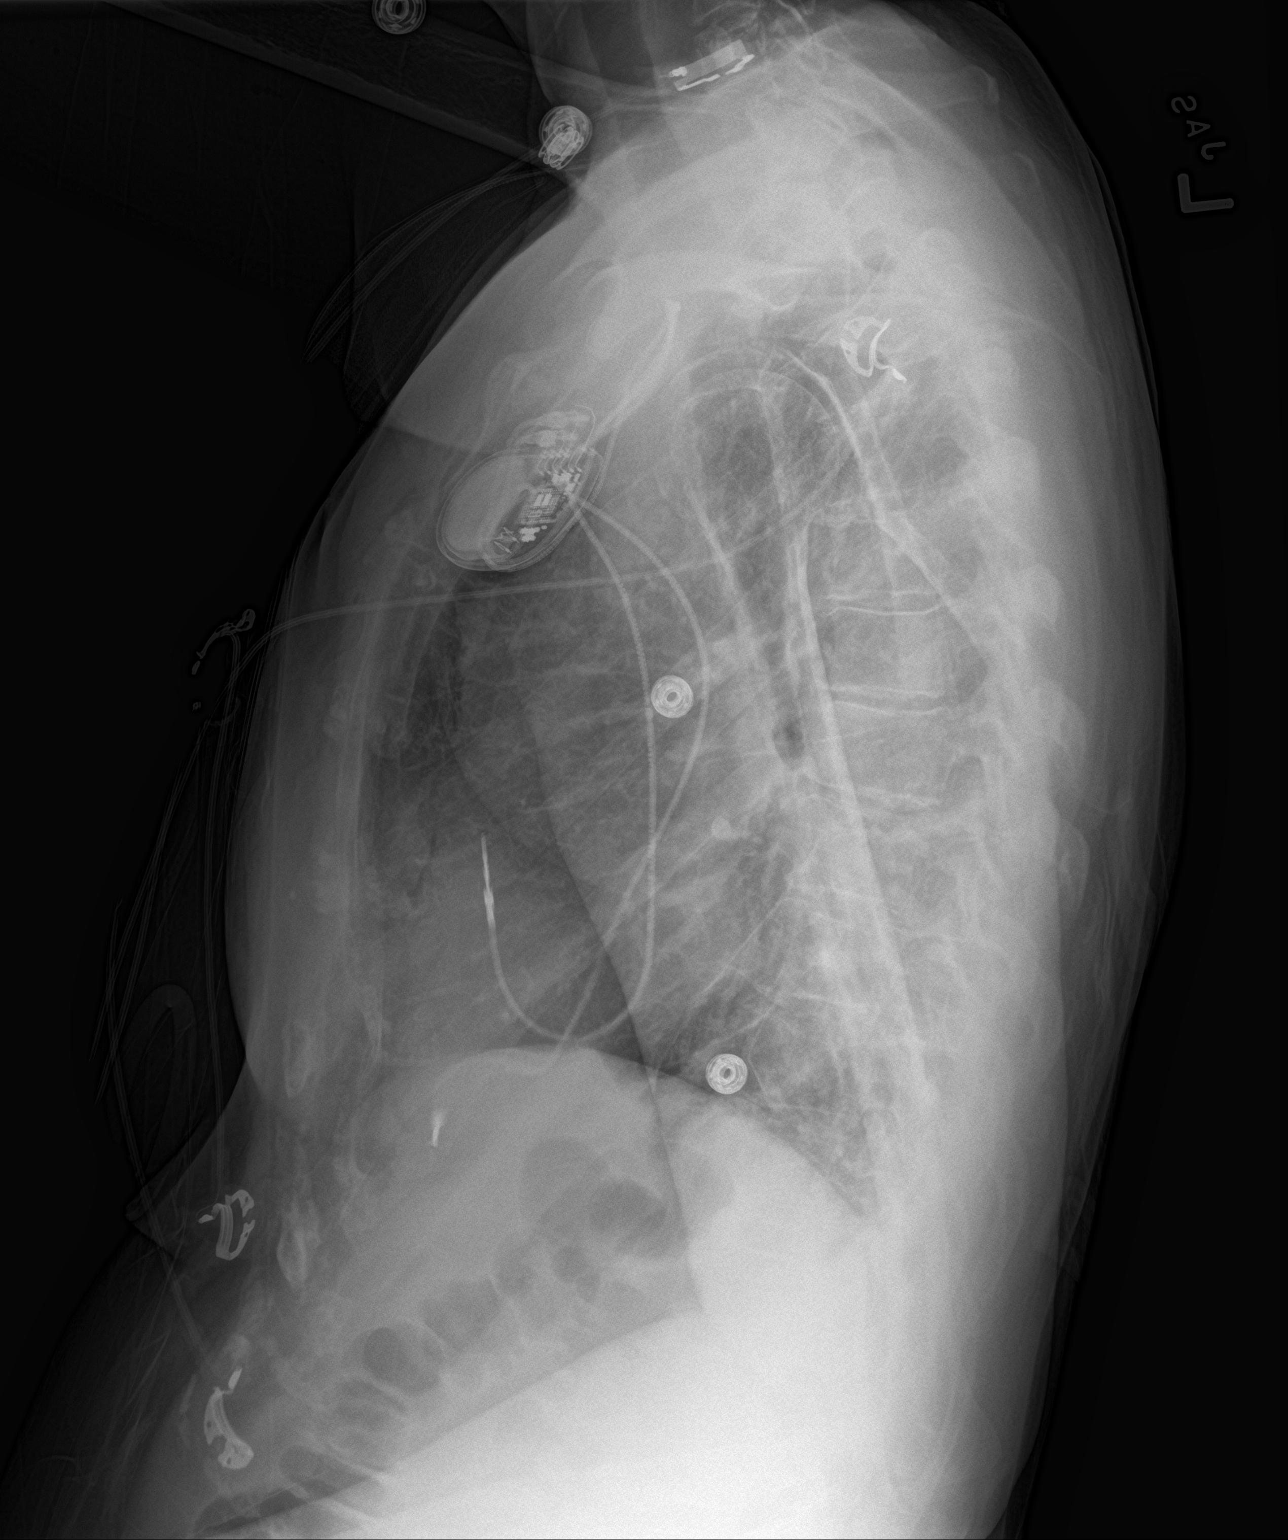

[2 of 2 positions shown; findings below may reference images not displayed]

FINDINGS: Grossly unchanged cardiac silhouette and mediastinal contours.
Stable positioning of newly placed left anterior chest wall dual
lead pacemaker with lead tips overlying expected location the right
atrium and ventricle. Slight progression of bibasilar heterogeneous
opacities, left greater than right likely atelectasis. No discrete
focal airspace opacities. No pleural effusion or pneumothorax. No
evidence of edema. No acute osseous abnormalities. A minimal amount
of subcutaneous emphysema is noted about the anterior chest wall
pacemaker implantation site, best appreciated on the provided
lateral radiograph.
IMPRESSION: 1. Stable positioning of support apparatus. No pneumothorax, though
there has been slight progression of nonspecific subcutaneous
emphysema about the pacemaker implantation site, best appreciated on
the lateral radiograph.
2. Slight worsening of bibasilar opacities, likely atelectasis.

## 2020-04-07 DIAGNOSIS — D225 Melanocytic nevi of trunk: Secondary | ICD-10-CM | POA: Diagnosis not present

## 2020-04-07 DIAGNOSIS — L905 Scar conditions and fibrosis of skin: Secondary | ICD-10-CM | POA: Diagnosis not present

## 2020-04-07 DIAGNOSIS — D1801 Hemangioma of skin and subcutaneous tissue: Secondary | ICD-10-CM | POA: Diagnosis not present

## 2020-04-07 DIAGNOSIS — L720 Epidermal cyst: Secondary | ICD-10-CM | POA: Diagnosis not present

## 2020-04-07 DIAGNOSIS — Z85828 Personal history of other malignant neoplasm of skin: Secondary | ICD-10-CM | POA: Diagnosis not present

## 2020-04-07 DIAGNOSIS — L814 Other melanin hyperpigmentation: Secondary | ICD-10-CM | POA: Diagnosis not present

## 2020-04-15 DIAGNOSIS — K529 Noninfective gastroenteritis and colitis, unspecified: Secondary | ICD-10-CM | POA: Diagnosis not present

## 2020-05-07 ENCOUNTER — Telehealth: Payer: Self-pay

## 2020-05-07 NOTE — Telephone Encounter (Signed)
Pt reports she awoke at 2am with racing HR.  She denies any other cardiac symptoms.  She is trying to send a manual transmission this AM. It appears [facility] is receiving a system update.  Pt not currently experience elevated HR.  She will await system update to complete and if necessary send at that time.

## 2020-05-18 DIAGNOSIS — R682 Dry mouth, unspecified: Secondary | ICD-10-CM | POA: Diagnosis not present

## 2020-05-27 ENCOUNTER — Ambulatory Visit (INDEPENDENT_AMBULATORY_CARE_PROVIDER_SITE_OTHER): Payer: Medicare PPO | Admitting: *Deleted

## 2020-05-27 DIAGNOSIS — I441 Atrioventricular block, second degree: Secondary | ICD-10-CM

## 2020-05-28 LAB — CUP PACEART REMOTE DEVICE CHECK
Battery Remaining Longevity: 127 mo
Battery Voltage: 3.03 V
Brady Statistic AP VP Percent: 1.35 %
Brady Statistic AP VS Percent: 10.18 %
Brady Statistic AS VP Percent: 30.24 %
Brady Statistic AS VS Percent: 58.22 %
Brady Statistic RA Percent Paced: 12 %
Brady Statistic RV Percent Paced: 31.6 %
Date Time Interrogation Session: 20210901203839
Implantable Lead Implant Date: 20200228
Implantable Lead Implant Date: 20200228
Implantable Lead Location: 753858
Implantable Lead Location: 753859
Implantable Lead Model: 5076
Implantable Lead Model: 5076
Implantable Pulse Generator Implant Date: 20200228
Lead Channel Impedance Value: 304 Ohm
Lead Channel Impedance Value: 361 Ohm
Lead Channel Impedance Value: 437 Ohm
Lead Channel Impedance Value: 494 Ohm
Lead Channel Pacing Threshold Amplitude: 0.5 V
Lead Channel Pacing Threshold Amplitude: 0.625 V
Lead Channel Pacing Threshold Pulse Width: 0.4 ms
Lead Channel Pacing Threshold Pulse Width: 0.4 ms
Lead Channel Sensing Intrinsic Amplitude: 4.25 mV
Lead Channel Sensing Intrinsic Amplitude: 4.25 mV
Lead Channel Sensing Intrinsic Amplitude: 6.875 mV
Lead Channel Sensing Intrinsic Amplitude: 6.875 mV
Lead Channel Setting Pacing Amplitude: 2 V
Lead Channel Setting Pacing Amplitude: 2.5 V
Lead Channel Setting Pacing Pulse Width: 0.4 ms
Lead Channel Setting Sensing Sensitivity: 2.8 mV

## 2020-05-30 DIAGNOSIS — I499 Cardiac arrhythmia, unspecified: Secondary | ICD-10-CM | POA: Diagnosis not present

## 2020-05-30 DIAGNOSIS — Z950 Presence of cardiac pacemaker: Secondary | ICD-10-CM | POA: Diagnosis not present

## 2020-06-01 NOTE — Progress Notes (Signed)
Remote pacemaker transmission.   

## 2020-06-15 ENCOUNTER — Other Ambulatory Visit: Payer: Self-pay | Admitting: Anesthesiology

## 2020-06-15 DIAGNOSIS — Z1231 Encounter for screening mammogram for malignant neoplasm of breast: Secondary | ICD-10-CM

## 2020-06-25 DIAGNOSIS — J392 Other diseases of pharynx: Secondary | ICD-10-CM | POA: Diagnosis not present

## 2020-06-25 DIAGNOSIS — L2489 Irritant contact dermatitis due to other agents: Secondary | ICD-10-CM | POA: Diagnosis not present

## 2020-06-30 DIAGNOSIS — K2950 Unspecified chronic gastritis without bleeding: Secondary | ICD-10-CM | POA: Diagnosis not present

## 2020-07-07 DIAGNOSIS — R030 Elevated blood-pressure reading, without diagnosis of hypertension: Secondary | ICD-10-CM | POA: Diagnosis not present

## 2020-07-09 ENCOUNTER — Other Ambulatory Visit: Payer: Self-pay | Admitting: Nurse Practitioner

## 2020-07-09 DIAGNOSIS — R1011 Right upper quadrant pain: Secondary | ICD-10-CM

## 2020-07-19 DIAGNOSIS — R300 Dysuria: Secondary | ICD-10-CM | POA: Diagnosis not present

## 2020-07-19 DIAGNOSIS — B9689 Other specified bacterial agents as the cause of diseases classified elsewhere: Secondary | ICD-10-CM | POA: Diagnosis not present

## 2020-07-19 DIAGNOSIS — N760 Acute vaginitis: Secondary | ICD-10-CM | POA: Diagnosis not present

## 2020-07-20 ENCOUNTER — Ambulatory Visit
Admission: RE | Admit: 2020-07-20 | Discharge: 2020-07-20 | Disposition: A | Discharge status: Home / Self Care | Payer: Medicare PPO | Source: Ambulatory Visit | Attending: Nurse Practitioner | Admitting: Nurse Practitioner

## 2020-07-20 DIAGNOSIS — K7689 Other specified diseases of liver: Secondary | ICD-10-CM | POA: Diagnosis not present

## 2020-07-20 DIAGNOSIS — R1011 Right upper quadrant pain: Secondary | ICD-10-CM

## 2020-07-27 ENCOUNTER — Ambulatory Visit: Payer: Medicare PPO

## 2020-07-29 DIAGNOSIS — R002 Palpitations: Secondary | ICD-10-CM | POA: Diagnosis not present

## 2020-07-31 DIAGNOSIS — R202 Paresthesia of skin: Secondary | ICD-10-CM | POA: Diagnosis not present

## 2020-07-31 DIAGNOSIS — R0781 Pleurodynia: Secondary | ICD-10-CM | POA: Diagnosis not present

## 2020-08-02 ENCOUNTER — Ambulatory Visit: Payer: Medicare PPO

## 2020-08-02 DIAGNOSIS — K219 Gastro-esophageal reflux disease without esophagitis: Secondary | ICD-10-CM | POA: Diagnosis not present

## 2020-08-02 DIAGNOSIS — F418 Other specified anxiety disorders: Secondary | ICD-10-CM | POA: Diagnosis not present

## 2020-08-02 DIAGNOSIS — N761 Subacute and chronic vaginitis: Secondary | ICD-10-CM | POA: Diagnosis not present

## 2020-08-13 DIAGNOSIS — R1011 Right upper quadrant pain: Secondary | ICD-10-CM | POA: Diagnosis not present

## 2020-08-13 DIAGNOSIS — R1013 Epigastric pain: Secondary | ICD-10-CM | POA: Diagnosis not present

## 2020-08-13 DIAGNOSIS — K7689 Other specified diseases of liver: Secondary | ICD-10-CM | POA: Diagnosis not present

## 2020-08-17 DIAGNOSIS — N761 Subacute and chronic vaginitis: Secondary | ICD-10-CM | POA: Diagnosis not present

## 2020-08-21 ENCOUNTER — Telehealth: Payer: Self-pay | Admitting: Physician Assistant

## 2020-08-21 NOTE — Telephone Encounter (Signed)
Returned call to patient. She has started xanax and protonix for stomach pain. She is seeing GI for stomach issues and is scheduled for EGD next week. She calls me describing abdominal discomfort. She also reports a vaginal infection. She feels very sluggish, weary, and overall feels bad. She has been taking 0.5 mg xanax x 5 days. Prior to this she was benzo naive. She reports drowsiness in the morning and doesn't feel like herself. I told her this sounds like xanax hangover effect and to stop the xanax for two days to see if she feels better. She is concerned the protonix if affecting her heart along. I do not think the protonix is causing problems, but did suggest a xanax holiday.

## 2020-08-23 DIAGNOSIS — N898 Other specified noninflammatory disorders of vagina: Secondary | ICD-10-CM | POA: Diagnosis not present

## 2020-08-24 DIAGNOSIS — Z1159 Encounter for screening for other viral diseases: Secondary | ICD-10-CM | POA: Diagnosis not present

## 2020-08-25 ENCOUNTER — Other Ambulatory Visit: Payer: Self-pay

## 2020-08-25 ENCOUNTER — Ambulatory Visit
Admission: RE | Admit: 2020-08-25 | Discharge: 2020-08-25 | Disposition: A | Discharge status: Home / Self Care | Payer: Medicare PPO | Source: Ambulatory Visit | Attending: Anesthesiology | Admitting: Anesthesiology

## 2020-08-25 DIAGNOSIS — Z1231 Encounter for screening mammogram for malignant neoplasm of breast: Secondary | ICD-10-CM

## 2020-08-26 ENCOUNTER — Ambulatory Visit (INDEPENDENT_AMBULATORY_CARE_PROVIDER_SITE_OTHER): Payer: Medicare PPO

## 2020-08-26 DIAGNOSIS — K2930 Chronic superficial gastritis without bleeding: Secondary | ICD-10-CM | POA: Diagnosis not present

## 2020-08-26 DIAGNOSIS — I441 Atrioventricular block, second degree: Secondary | ICD-10-CM | POA: Diagnosis not present

## 2020-08-26 DIAGNOSIS — K229 Disease of esophagus, unspecified: Secondary | ICD-10-CM | POA: Diagnosis not present

## 2020-08-26 DIAGNOSIS — B3781 Candidal esophagitis: Secondary | ICD-10-CM | POA: Diagnosis not present

## 2020-08-26 DIAGNOSIS — R1013 Epigastric pain: Secondary | ICD-10-CM | POA: Diagnosis not present

## 2020-08-26 LAB — CUP PACEART REMOTE DEVICE CHECK
Battery Remaining Longevity: 128 mo
Battery Voltage: 3.02 V
Brady Statistic AP VP Percent: 0.07 %
Brady Statistic AP VS Percent: 4.85 %
Brady Statistic AS VP Percent: 3.83 %
Brady Statistic AS VS Percent: 91.26 %
Brady Statistic RA Percent Paced: 5.42 %
Brady Statistic RV Percent Paced: 3.9 %
Date Time Interrogation Session: 20211201230236
Implantable Lead Implant Date: 20200228
Implantable Lead Implant Date: 20200228
Implantable Lead Location: 753858
Implantable Lead Location: 753859
Implantable Lead Model: 5076
Implantable Lead Model: 5076
Implantable Pulse Generator Implant Date: 20200228
Lead Channel Impedance Value: 323 Ohm
Lead Channel Impedance Value: 361 Ohm
Lead Channel Impedance Value: 437 Ohm
Lead Channel Impedance Value: 494 Ohm
Lead Channel Pacing Threshold Amplitude: 0.5 V
Lead Channel Pacing Threshold Amplitude: 0.5 V
Lead Channel Pacing Threshold Pulse Width: 0.4 ms
Lead Channel Pacing Threshold Pulse Width: 0.4 ms
Lead Channel Sensing Intrinsic Amplitude: 4.5 mV
Lead Channel Sensing Intrinsic Amplitude: 4.5 mV
Lead Channel Sensing Intrinsic Amplitude: 7.375 mV
Lead Channel Sensing Intrinsic Amplitude: 7.375 mV
Lead Channel Setting Pacing Amplitude: 2 V
Lead Channel Setting Pacing Amplitude: 2.5 V
Lead Channel Setting Pacing Pulse Width: 0.4 ms
Lead Channel Setting Sensing Sensitivity: 2.8 mV

## 2020-08-27 ENCOUNTER — Other Ambulatory Visit: Payer: Self-pay | Admitting: Anesthesiology

## 2020-08-27 ENCOUNTER — Other Ambulatory Visit: Payer: Self-pay | Admitting: Nurse Practitioner

## 2020-08-27 DIAGNOSIS — N6320 Unspecified lump in the left breast, unspecified quadrant: Secondary | ICD-10-CM

## 2020-08-27 DIAGNOSIS — R928 Other abnormal and inconclusive findings on diagnostic imaging of breast: Secondary | ICD-10-CM

## 2020-08-27 DIAGNOSIS — N6310 Unspecified lump in the right breast, unspecified quadrant: Secondary | ICD-10-CM

## 2020-08-28 ENCOUNTER — Ambulatory Visit
Admission: RE | Admit: 2020-08-28 | Discharge: 2020-08-28 | Disposition: A | Discharge status: Home / Self Care | Payer: Medicare PPO | Source: Ambulatory Visit | Attending: Anesthesiology | Admitting: Anesthesiology

## 2020-08-28 ENCOUNTER — Other Ambulatory Visit: Payer: Self-pay

## 2020-08-28 DIAGNOSIS — R928 Other abnormal and inconclusive findings on diagnostic imaging of breast: Secondary | ICD-10-CM

## 2020-08-28 DIAGNOSIS — N6012 Diffuse cystic mastopathy of left breast: Secondary | ICD-10-CM | POA: Diagnosis not present

## 2020-08-28 DIAGNOSIS — N6011 Diffuse cystic mastopathy of right breast: Secondary | ICD-10-CM | POA: Diagnosis not present

## 2020-08-28 DIAGNOSIS — R922 Inconclusive mammogram: Secondary | ICD-10-CM | POA: Diagnosis not present

## 2020-08-29 DIAGNOSIS — F419 Anxiety disorder, unspecified: Secondary | ICD-10-CM | POA: Diagnosis not present

## 2020-08-29 DIAGNOSIS — Z6823 Body mass index (BMI) 23.0-23.9, adult: Secondary | ICD-10-CM | POA: Diagnosis not present

## 2020-08-29 DIAGNOSIS — K2950 Unspecified chronic gastritis without bleeding: Secondary | ICD-10-CM | POA: Diagnosis not present

## 2020-08-31 DIAGNOSIS — F419 Anxiety disorder, unspecified: Secondary | ICD-10-CM | POA: Diagnosis not present

## 2020-09-01 DIAGNOSIS — B3781 Candidal esophagitis: Secondary | ICD-10-CM | POA: Diagnosis not present

## 2020-09-01 DIAGNOSIS — N760 Acute vaginitis: Secondary | ICD-10-CM | POA: Diagnosis not present

## 2020-09-01 DIAGNOSIS — K2930 Chronic superficial gastritis without bleeding: Secondary | ICD-10-CM | POA: Diagnosis not present

## 2020-09-02 NOTE — Progress Notes (Signed)
Remote pacemaker transmission.   

## 2020-09-08 DIAGNOSIS — F411 Generalized anxiety disorder: Secondary | ICD-10-CM | POA: Diagnosis not present

## 2020-09-13 DIAGNOSIS — Z20822 Contact with and (suspected) exposure to covid-19: Secondary | ICD-10-CM | POA: Diagnosis not present

## 2020-09-20 DIAGNOSIS — F411 Generalized anxiety disorder: Secondary | ICD-10-CM | POA: Diagnosis not present

## 2020-10-04 DIAGNOSIS — F411 Generalized anxiety disorder: Secondary | ICD-10-CM | POA: Diagnosis not present

## 2020-11-02 DIAGNOSIS — F411 Generalized anxiety disorder: Secondary | ICD-10-CM | POA: Diagnosis not present

## 2020-11-08 DIAGNOSIS — K5900 Constipation, unspecified: Secondary | ICD-10-CM | POA: Diagnosis not present

## 2020-11-08 DIAGNOSIS — K219 Gastro-esophageal reflux disease without esophagitis: Secondary | ICD-10-CM | POA: Diagnosis not present

## 2020-11-08 DIAGNOSIS — K625 Hemorrhage of anus and rectum: Secondary | ICD-10-CM | POA: Diagnosis not present

## 2020-11-08 DIAGNOSIS — F419 Anxiety disorder, unspecified: Secondary | ICD-10-CM | POA: Diagnosis not present

## 2020-11-08 DIAGNOSIS — K2970 Gastritis, unspecified, without bleeding: Secondary | ICD-10-CM | POA: Diagnosis not present

## 2020-11-08 DIAGNOSIS — B3781 Candidal esophagitis: Secondary | ICD-10-CM | POA: Diagnosis not present

## 2020-11-09 NOTE — Progress Notes (Signed)
Electrophysiology Office Note Date: 11/10/2020  ID:  Donna Brady, DOB 1951/01/27, MRN [id number]  PCP: Mitzi Hansen, NP Primary Cardiologist: Rollene Rotunda, MD Electrophysiologist: Will Jorja Loa, MD   CC: Pacemaker follow-up  Donna Brady is a 70 y.o. female seen today for Will Jorja Loa, MD for routine electrophysiology followup.  Since last being seen in our clinic the patient reports doing OK. She has a very difficult end of the year with medical issues and the passing of her twin brother.  She is followed now by a therapist and doing much better.  she denies chest pain, palpitations, dyspnea, PND, orthopnea, nausea, vomiting, dizziness, syncope, edema, weight gain, or early satiety.  She is moving to [facility] [place] by the end of April to be closer to her sons. She has not yet established cardiology care there.  Device History: Medtronic Dual Chamber PPM implanted 11/22/2018 for Advanced HB. S/p Lead revision 11/24/2018 due to loss of capture.  Past Medical History:  Diagnosis Date  . MR (congenital mitral regurgitation)   . MVP (mitral valve prolapse)   . Nephrolithiasis   . Pacemaker lead malfunction due to dislodgement with removal of lead and new lead placed 11/23/18 11/24/2018  . S/P placement of cardiac pacemaker 11/22/18 MDT  11/23/2018   Past Surgical History:  Procedure Laterality Date  . BREAST BIOPSY Right 2014  . CESAREAN SECTION     3  . PACEMAKER IMPLANT N/A 11/22/2018   Procedure: PACEMAKER IMPLANT;  Surgeon: Regan Lemming, MD;  Location: [facility] INVASIVE CV LAB;  Service: Cardiovascular;  Laterality: N/A;  . PACEMAKER REVISION N/A 11/23/2018   Procedure: PACEMAKER REVISION;  Surgeon: Duke Salvia, MD;  Location: [facility] INVASIVE CV LAB;  Service: Cardiovascular;  Laterality: N/A;  . TUBAL LIGATION      Current Outpatient Medications  Medication Sig Dispense Refill  . acetaminophen (TYLENOL) 325 MG tablet Take 650 mg by mouth every 6 (six)  hours as needed for moderate pain or headache.    . diazepam (VALIUM) 5 MG tablet diazepam 5 mg tablet    . Multiple Vitamin (MULTIVITAMIN) tablet Take 1 tablet by mouth daily.     No current facility-administered medications for this visit.    Allergies:   Adhesive [tape] and Diazepam   Social History: Social History   Socioeconomic History  . Marital status: Widowed    Spouse name: Not on file  . Number of children: 3  . Years of education: Not on file  . Highest education level: Not on file  Occupational History  . Not on file  Tobacco Use  . Smoking status: Never Smoker  . Smokeless tobacco: Never Used  Vaping Use  . Vaping Use: Never used  Substance and Sexual Activity  . Alcohol use: Not on file  . Drug use: Not on file  . Sexual activity: Not on file  Other Topics Concern  . Not on file  Social History Narrative   Lives at home with husband.   Social Determinants of Health   Financial Resource Strain: Not on file  Food Insecurity: Not on file  Transportation Needs: Not on file  Physical Activity: Not on file  Stress: Not on file  Social Connections: Not on file  Intimate Partner Violence: Not on file    Family History: Family History  Problem Relation Age of Onset  . Cancer Father   . Parkinson's disease Father   . Dementia Mother      Review  of Systems: All other systems reviewed and are otherwise negative except as noted above.  Physical Exam: Vitals:   11/10/20 1107  BP: 124/64  Pulse: 80  SpO2: 96%  Weight: 142 lb (64.4 kg)  Height: 5\' 5"  (1.651 m)     GEN- The patient is well appearing, alert and oriented x 3 today.   HEENT: normocephalic, atraumatic; sclera clear, conjunctiva pink; hearing intact; oropharynx clear; neck supple  Lungs- Clear to ausculation bilaterally, normal work of breathing.  No wheezes, rales, rhonchi Heart- Regular rate and rhythm, no murmurs, rubs or gallops  GI- soft, non-tender, non-distended, bowel sounds  present  Extremities- no clubbing or cyanosis. No edema MS- no significant deformity or atrophy Skin- warm and dry, no rash or lesion; PPM pocket well healed Psych- euthymic mood, full affect Neuro- strength and sensation are intact  PPM Interrogation- reviewed in detail today,  See PACEART report  EKG:  EKG is ordered today. Personal review shows NSR at 80 bpm with PR interval 194 ms, QRS 170 ms (normal EF 10/2018)  Recent Labs: 11/24/2019: Magnesium 2.1; TSH 1.600 12/16/2019: BUN 12; Creatinine, Ser 0.65; Hemoglobin 14.0; Platelets 218; Potassium 3.9; Sodium 143   Wt Readings from Last 3 Encounters:  11/10/20 142 lb (64.4 kg)  12/24/19 146 lb 6.4 oz (66.4 kg)  12/16/19 143 lb (64.9 kg)     Other studies Reviewed: Additional studies/ records that were reviewed today include: Previous EP office notes, Previous remote checks, Most recent labwork.   Assessment and Plan:  1. Advanced AV block s/p Medtronic PPM  Normal PPM function.  See Pace Art report No changes today  2. Palpitations Histograms appear appropriate with infrequent excursions into 130-140s  3. Anxiety Per PCP.   4. [place] I encouraged her to ask her two sons primary physicians for recommendations for cardiology/EP follow up, as they would know the area much better.   Current medicines are reviewed at length with the patient today.   The patient does not have concerns regarding her medicines.  The following changes were made today:  None  Labs/ tests ordered today include:  No orders of the defined types were placed in this encounter.   Disposition:   Follow up with Dr. Jac Canavan as needed while in [place]. We will continue to monitor her remotes until she establishes with cards in [facility].   [facility], PA-C  11/10/2020 11:20 AM  [facility] [address] [place] [id number] (office) [phone] (fax)

## 2020-11-10 ENCOUNTER — Encounter: Payer: Self-pay | Admitting: Student

## 2020-11-10 ENCOUNTER — Other Ambulatory Visit: Payer: Self-pay

## 2020-11-10 ENCOUNTER — Ambulatory Visit: Payer: Medicare PPO | Admitting: Student

## 2020-11-10 VITALS — BP 124/64 | HR 80 | Ht 65.0 in | Wt 142.0 lb

## 2020-11-10 DIAGNOSIS — F419 Anxiety disorder, unspecified: Secondary | ICD-10-CM | POA: Diagnosis not present

## 2020-11-10 DIAGNOSIS — R002 Palpitations: Secondary | ICD-10-CM | POA: Diagnosis not present

## 2020-11-10 DIAGNOSIS — Z20822 Contact with and (suspected) exposure to covid-19: Secondary | ICD-10-CM | POA: Diagnosis not present

## 2020-11-10 DIAGNOSIS — I441 Atrioventricular block, second degree: Secondary | ICD-10-CM | POA: Diagnosis not present

## 2020-11-10 LAB — CUP PACEART INCLINIC DEVICE CHECK
Battery Remaining Longevity: 123 mo
Battery Voltage: 3.01 V
Brady Statistic AP VP Percent: 0.52 %
Brady Statistic AP VS Percent: 6.1 %
Brady Statistic AS VP Percent: 29.59 %
Brady Statistic AS VS Percent: 63.79 %
Brady Statistic RA Percent Paced: 6.96 %
Brady Statistic RV Percent Paced: 30.11 %
Date Time Interrogation Session: 20220216113849
Implantable Lead Implant Date: 20200228
Implantable Lead Implant Date: 20200228
Implantable Lead Location: 753858
Implantable Lead Location: 753859
Implantable Lead Model: 5076
Implantable Lead Model: 5076
Implantable Pulse Generator Implant Date: 20200228
Lead Channel Impedance Value: 323 Ohm
Lead Channel Impedance Value: 380 Ohm
Lead Channel Impedance Value: 399 Ohm
Lead Channel Impedance Value: 475 Ohm
Lead Channel Pacing Threshold Amplitude: 0.625 V
Lead Channel Pacing Threshold Amplitude: 0.625 V
Lead Channel Pacing Threshold Pulse Width: 0.4 ms
Lead Channel Pacing Threshold Pulse Width: 0.4 ms
Lead Channel Sensing Intrinsic Amplitude: 4.25 mV
Lead Channel Sensing Intrinsic Amplitude: 4.75 mV
Lead Channel Sensing Intrinsic Amplitude: 6.875 mV
Lead Channel Sensing Intrinsic Amplitude: 6.875 mV
Lead Channel Setting Pacing Amplitude: 2 V
Lead Channel Setting Pacing Amplitude: 2.5 V
Lead Channel Setting Pacing Pulse Width: 0.4 ms
Lead Channel Setting Sensing Sensitivity: 2.8 mV

## 2020-11-10 NOTE — Patient Instructions (Signed)
Medication Instructions: Your physician recommends that you continue on your current medications as directed. Please refer to the Current Medication list given to you today.  *If you need a refill on your cardiac medications before your next appointment, please call your pharmacy*   Lab Work: None Today If you have labs (blood work) drawn today and your tests are completely normal, you will receive your results only by: Marland Kitchen MyChart Message (if you have MyChart) OR . A paper copy in the mail If you have any lab test that is abnormal or we need to change your treatment, we will call you to review the results.   Follow-Up: At [facility], you and your health needs are our priority.  As part of our continuing mission to provide you with exceptional heart care, we have created designated Provider Care Teams.  These Care Teams include your primary Cardiologist (physician) and Advanced Practice Providers (APPs -  Physician Assistants and Nurse Practitioners) who all work together to provide you with the care you need, when you need it.  Follow up As needed

## 2020-11-19 DIAGNOSIS — N898 Other specified noninflammatory disorders of vagina: Secondary | ICD-10-CM | POA: Diagnosis not present

## 2020-11-19 DIAGNOSIS — Z01419 Encounter for gynecological examination (general) (routine) without abnormal findings: Secondary | ICD-10-CM | POA: Diagnosis not present

## 2020-11-19 DIAGNOSIS — Z124 Encounter for screening for malignant neoplasm of cervix: Secondary | ICD-10-CM | POA: Diagnosis not present

## 2020-11-25 ENCOUNTER — Ambulatory Visit (INDEPENDENT_AMBULATORY_CARE_PROVIDER_SITE_OTHER): Payer: Medicare PPO

## 2020-11-25 DIAGNOSIS — F411 Generalized anxiety disorder: Secondary | ICD-10-CM | POA: Diagnosis not present

## 2020-11-25 DIAGNOSIS — I441 Atrioventricular block, second degree: Secondary | ICD-10-CM | POA: Diagnosis not present

## 2020-11-25 DIAGNOSIS — Z01419 Encounter for gynecological examination (general) (routine) without abnormal findings: Secondary | ICD-10-CM | POA: Diagnosis not present

## 2020-11-29 LAB — CUP PACEART REMOTE DEVICE CHECK
Battery Remaining Longevity: 115 mo
Battery Voltage: 3 V
Brady Statistic AP VP Percent: 0.87 %
Brady Statistic AP VS Percent: 0 %
Brady Statistic AS VP Percent: 98.7 %
Brady Statistic AS VS Percent: 0.42 %
Brady Statistic RA Percent Paced: 0.84 %
Brady Statistic RV Percent Paced: 99.58 %
Date Time Interrogation Session: 20220302214925
Implantable Lead Implant Date: 20200228
Implantable Lead Implant Date: 20200228
Implantable Lead Location: 753858
Implantable Lead Location: 753859
Implantable Lead Model: 5076
Implantable Lead Model: 5076
Implantable Pulse Generator Implant Date: 20200228
Lead Channel Impedance Value: 304 Ohm
Lead Channel Impedance Value: 323 Ohm
Lead Channel Impedance Value: 361 Ohm
Lead Channel Impedance Value: 361 Ohm
Lead Channel Pacing Threshold Amplitude: 0.625 V
Lead Channel Pacing Threshold Amplitude: 0.625 V
Lead Channel Pacing Threshold Pulse Width: 0.4 ms
Lead Channel Pacing Threshold Pulse Width: 0.4 ms
Lead Channel Sensing Intrinsic Amplitude: 4.5 mV
Lead Channel Sensing Intrinsic Amplitude: 4.5 mV
Lead Channel Sensing Intrinsic Amplitude: 6.875 mV
Lead Channel Sensing Intrinsic Amplitude: 6.875 mV
Lead Channel Setting Pacing Amplitude: 2 V
Lead Channel Setting Pacing Amplitude: 2.5 V
Lead Channel Setting Pacing Pulse Width: 0.4 ms
Lead Channel Setting Sensing Sensitivity: 2.8 mV

## 2020-12-06 NOTE — Progress Notes (Signed)
Remote pacemaker transmission.   

## 2020-12-17 DIAGNOSIS — F411 Generalized anxiety disorder: Secondary | ICD-10-CM | POA: Diagnosis not present

## 2020-12-30 DIAGNOSIS — F411 Generalized anxiety disorder: Secondary | ICD-10-CM | POA: Diagnosis not present

## 2021-01-13 DIAGNOSIS — F411 Generalized anxiety disorder: Secondary | ICD-10-CM | POA: Diagnosis not present

## 2021-02-09 DIAGNOSIS — F419 Anxiety disorder, unspecified: Secondary | ICD-10-CM | POA: Diagnosis not present

## 2021-02-09 DIAGNOSIS — G4700 Insomnia, unspecified: Secondary | ICD-10-CM | POA: Diagnosis not present

## 2021-02-09 DIAGNOSIS — Z950 Presence of cardiac pacemaker: Secondary | ICD-10-CM | POA: Diagnosis not present

## 2021-02-10 DIAGNOSIS — Z950 Presence of cardiac pacemaker: Secondary | ICD-10-CM | POA: Diagnosis not present

## 2021-02-10 DIAGNOSIS — R42 Dizziness and giddiness: Secondary | ICD-10-CM | POA: Diagnosis not present

## 2021-02-10 DIAGNOSIS — I495 Sick sinus syndrome: Secondary | ICD-10-CM | POA: Diagnosis not present

## 2021-02-15 DIAGNOSIS — I495 Sick sinus syndrome: Secondary | ICD-10-CM | POA: Diagnosis not present

## 2021-02-24 ENCOUNTER — Ambulatory Visit (INDEPENDENT_AMBULATORY_CARE_PROVIDER_SITE_OTHER): Payer: Medicare PPO

## 2021-02-24 DIAGNOSIS — I441 Atrioventricular block, second degree: Secondary | ICD-10-CM

## 2021-02-24 LAB — CUP PACEART REMOTE DEVICE CHECK
Battery Remaining Longevity: 117 mo
Battery Voltage: 3.01 V
Brady Statistic AP VP Percent: 2.94 %
Brady Statistic AP VS Percent: 4.96 %
Brady Statistic AS VP Percent: 75.46 %
Brady Statistic AS VS Percent: 16.64 %
Brady Statistic RA Percent Paced: 7.87 %
Brady Statistic RV Percent Paced: 78.4 %
Date Time Interrogation Session: 20220601233145
Implantable Lead Implant Date: 20200228
Implantable Lead Implant Date: 20200228
Implantable Lead Location: 753858
Implantable Lead Location: 753859
Implantable Lead Model: 5076
Implantable Lead Model: 5076
Implantable Pulse Generator Implant Date: 20200228
Lead Channel Impedance Value: 304 Ohm
Lead Channel Impedance Value: 361 Ohm
Lead Channel Impedance Value: 380 Ohm
Lead Channel Impedance Value: 437 Ohm
Lead Channel Pacing Threshold Amplitude: 0.625 V
Lead Channel Pacing Threshold Amplitude: 0.625 V
Lead Channel Pacing Threshold Pulse Width: 0.4 ms
Lead Channel Pacing Threshold Pulse Width: 0.4 ms
Lead Channel Sensing Intrinsic Amplitude: 4.125 mV
Lead Channel Sensing Intrinsic Amplitude: 4.125 mV
Lead Channel Sensing Intrinsic Amplitude: 6.625 mV
Lead Channel Sensing Intrinsic Amplitude: 6.625 mV
Lead Channel Setting Pacing Amplitude: 2 V
Lead Channel Setting Pacing Amplitude: 2.5 V
Lead Channel Setting Pacing Pulse Width: 0.4 ms
Lead Channel Setting Sensing Sensitivity: 2.8 mV

## 2021-03-07 DIAGNOSIS — I495 Sick sinus syndrome: Secondary | ICD-10-CM | POA: Diagnosis not present

## 2021-03-10 DIAGNOSIS — F411 Generalized anxiety disorder: Secondary | ICD-10-CM | POA: Diagnosis not present

## 2021-03-10 DIAGNOSIS — Z950 Presence of cardiac pacemaker: Secondary | ICD-10-CM | POA: Diagnosis not present

## 2021-03-21 NOTE — Progress Notes (Signed)
Remote pacemaker transmission.   

## 2021-03-23 DIAGNOSIS — F411 Generalized anxiety disorder: Secondary | ICD-10-CM | POA: Diagnosis not present

## 2021-04-05 DIAGNOSIS — N762 Acute vulvitis: Secondary | ICD-10-CM | POA: Diagnosis not present

## 2021-04-13 DIAGNOSIS — F411 Generalized anxiety disorder: Secondary | ICD-10-CM | POA: Diagnosis not present

## 2021-05-11 DIAGNOSIS — F411 Generalized anxiety disorder: Secondary | ICD-10-CM | POA: Diagnosis not present

## 2021-06-01 DIAGNOSIS — N949 Unspecified condition associated with female genital organs and menstrual cycle: Secondary | ICD-10-CM | POA: Diagnosis not present

## 2021-06-01 DIAGNOSIS — N952 Postmenopausal atrophic vaginitis: Secondary | ICD-10-CM | POA: Diagnosis not present

## 2021-06-15 DIAGNOSIS — B370 Candidal stomatitis: Secondary | ICD-10-CM | POA: Diagnosis not present

## 2021-06-15 DIAGNOSIS — Z01419 Encounter for gynecological examination (general) (routine) without abnormal findings: Secondary | ICD-10-CM | POA: Diagnosis not present

## 2021-06-15 DIAGNOSIS — Z1231 Encounter for screening mammogram for malignant neoplasm of breast: Secondary | ICD-10-CM | POA: Diagnosis not present

## 2021-06-15 DIAGNOSIS — R922 Inconclusive mammogram: Secondary | ICD-10-CM | POA: Diagnosis not present

## 2021-06-24 DIAGNOSIS — G4700 Insomnia, unspecified: Secondary | ICD-10-CM | POA: Diagnosis not present

## 2021-06-24 DIAGNOSIS — F419 Anxiety disorder, unspecified: Secondary | ICD-10-CM | POA: Diagnosis not present

## 2021-06-24 DIAGNOSIS — I499 Cardiac arrhythmia, unspecified: Secondary | ICD-10-CM | POA: Diagnosis not present

## 2021-06-24 DIAGNOSIS — Z950 Presence of cardiac pacemaker: Secondary | ICD-10-CM | POA: Diagnosis not present

## 2021-07-01 DIAGNOSIS — H6122 Impacted cerumen, left ear: Secondary | ICD-10-CM | POA: Diagnosis not present

## 2021-07-01 DIAGNOSIS — B370 Candidal stomatitis: Secondary | ICD-10-CM | POA: Diagnosis not present

## 2021-07-07 DIAGNOSIS — R350 Frequency of micturition: Secondary | ICD-10-CM | POA: Diagnosis not present

## 2021-07-07 DIAGNOSIS — F419 Anxiety disorder, unspecified: Secondary | ICD-10-CM | POA: Diagnosis not present

## 2021-07-18 DIAGNOSIS — N3941 Urge incontinence: Secondary | ICD-10-CM | POA: Diagnosis not present

## 2021-07-18 DIAGNOSIS — R351 Nocturia: Secondary | ICD-10-CM | POA: Diagnosis not present

## 2021-07-18 DIAGNOSIS — R102 Pelvic and perineal pain: Secondary | ICD-10-CM | POA: Diagnosis not present

## 2021-07-18 DIAGNOSIS — R3915 Urgency of urination: Secondary | ICD-10-CM | POA: Diagnosis not present

## 2021-07-25 DIAGNOSIS — R3915 Urgency of urination: Secondary | ICD-10-CM | POA: Diagnosis not present

## 2021-07-25 DIAGNOSIS — R351 Nocturia: Secondary | ICD-10-CM | POA: Diagnosis not present

## 2021-07-25 DIAGNOSIS — N3941 Urge incontinence: Secondary | ICD-10-CM | POA: Diagnosis not present

## 2021-08-23 DIAGNOSIS — Z950 Presence of cardiac pacemaker: Secondary | ICD-10-CM | POA: Diagnosis not present

## 2021-10-24 DIAGNOSIS — Z79899 Other long term (current) drug therapy: Secondary | ICD-10-CM | POA: Diagnosis not present

## 2021-10-24 DIAGNOSIS — F32A Depression, unspecified: Secondary | ICD-10-CM | POA: Diagnosis not present

## 2021-10-24 DIAGNOSIS — R413 Other amnesia: Secondary | ICD-10-CM | POA: Diagnosis not present

## 2021-10-24 DIAGNOSIS — Z113 Encounter for screening for infections with a predominantly sexual mode of transmission: Secondary | ICD-10-CM | POA: Diagnosis not present

## 2021-11-03 DIAGNOSIS — F411 Generalized anxiety disorder: Secondary | ICD-10-CM | POA: Diagnosis not present

## 2021-11-03 DIAGNOSIS — F331 Major depressive disorder, recurrent, moderate: Secondary | ICD-10-CM | POA: Diagnosis not present

## 2021-11-21 DIAGNOSIS — I495 Sick sinus syndrome: Secondary | ICD-10-CM | POA: Diagnosis not present

## 2021-11-21 DIAGNOSIS — N952 Postmenopausal atrophic vaginitis: Secondary | ICD-10-CM | POA: Diagnosis not present

## 2021-11-21 DIAGNOSIS — F419 Anxiety disorder, unspecified: Secondary | ICD-10-CM | POA: Diagnosis not present

## 2021-11-21 DIAGNOSIS — N3281 Overactive bladder: Secondary | ICD-10-CM | POA: Diagnosis not present

## 2021-11-21 DIAGNOSIS — G3184 Mild cognitive impairment, so stated: Secondary | ICD-10-CM | POA: Diagnosis not present

## 2021-11-24 DIAGNOSIS — F411 Generalized anxiety disorder: Secondary | ICD-10-CM | POA: Diagnosis not present

## 2021-11-24 DIAGNOSIS — F331 Major depressive disorder, recurrent, moderate: Secondary | ICD-10-CM | POA: Diagnosis not present

## 2021-12-07 DIAGNOSIS — Z79899 Other long term (current) drug therapy: Secondary | ICD-10-CM | POA: Diagnosis not present

## 2021-12-09 DIAGNOSIS — F419 Anxiety disorder, unspecified: Secondary | ICD-10-CM | POA: Diagnosis not present

## 2021-12-09 DIAGNOSIS — E538 Deficiency of other specified B group vitamins: Secondary | ICD-10-CM | POA: Diagnosis not present

## 2021-12-09 DIAGNOSIS — N952 Postmenopausal atrophic vaginitis: Secondary | ICD-10-CM | POA: Diagnosis not present

## 2021-12-09 DIAGNOSIS — R413 Other amnesia: Secondary | ICD-10-CM | POA: Diagnosis not present

## 2021-12-15 DIAGNOSIS — Z79899 Other long term (current) drug therapy: Secondary | ICD-10-CM | POA: Diagnosis not present

## 2021-12-15 DIAGNOSIS — G4700 Insomnia, unspecified: Secondary | ICD-10-CM | POA: Diagnosis not present

## 2021-12-15 DIAGNOSIS — R413 Other amnesia: Secondary | ICD-10-CM | POA: Diagnosis not present

## 2021-12-15 DIAGNOSIS — G4710 Hypersomnia, unspecified: Secondary | ICD-10-CM | POA: Diagnosis not present

## 2021-12-20 DIAGNOSIS — Z79899 Other long term (current) drug therapy: Secondary | ICD-10-CM | POA: Diagnosis not present

## 2021-12-22 DIAGNOSIS — F411 Generalized anxiety disorder: Secondary | ICD-10-CM | POA: Diagnosis not present

## 2021-12-22 DIAGNOSIS — F331 Major depressive disorder, recurrent, moderate: Secondary | ICD-10-CM | POA: Diagnosis not present

## 2021-12-22 DIAGNOSIS — F5105 Insomnia due to other mental disorder: Secondary | ICD-10-CM | POA: Diagnosis not present

## 2021-12-28 DIAGNOSIS — Z79899 Other long term (current) drug therapy: Secondary | ICD-10-CM | POA: Diagnosis not present

## 2021-12-29 IMAGING — MG DIGITAL SCREENING BILAT W/ TOMO W/ CAD
8 series · 8 of 24 positions shown · non-contrast
Comparison: Previous exam(s).

CLINICAL DATA: Screening.

EXAM:
DIGITAL SCREENING BILATERAL MAMMOGRAM WITH TOMO AND CAD

[R CC synth-2D]
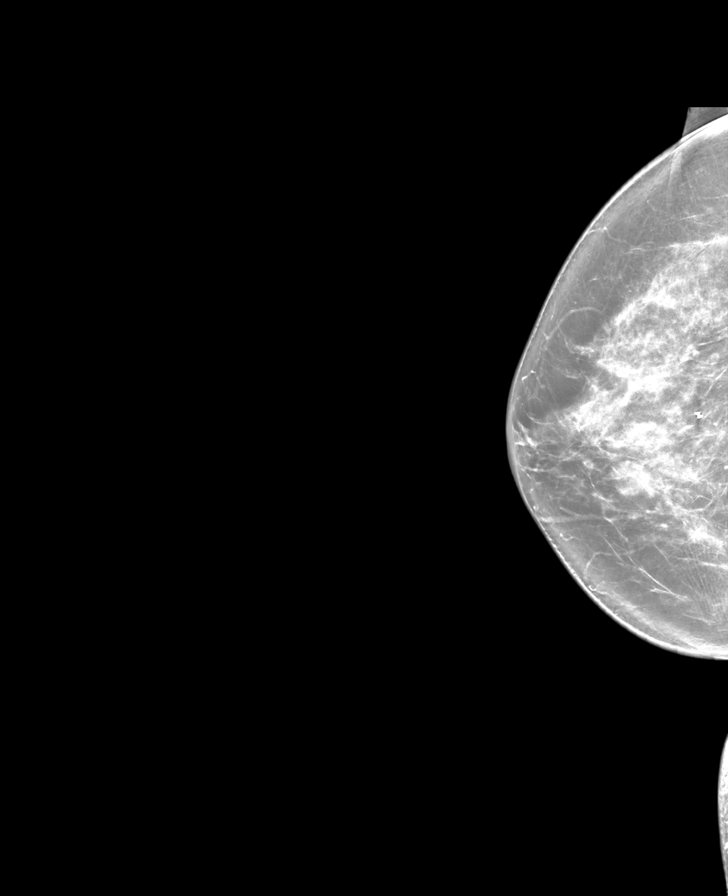

[L MLO synth-2D]
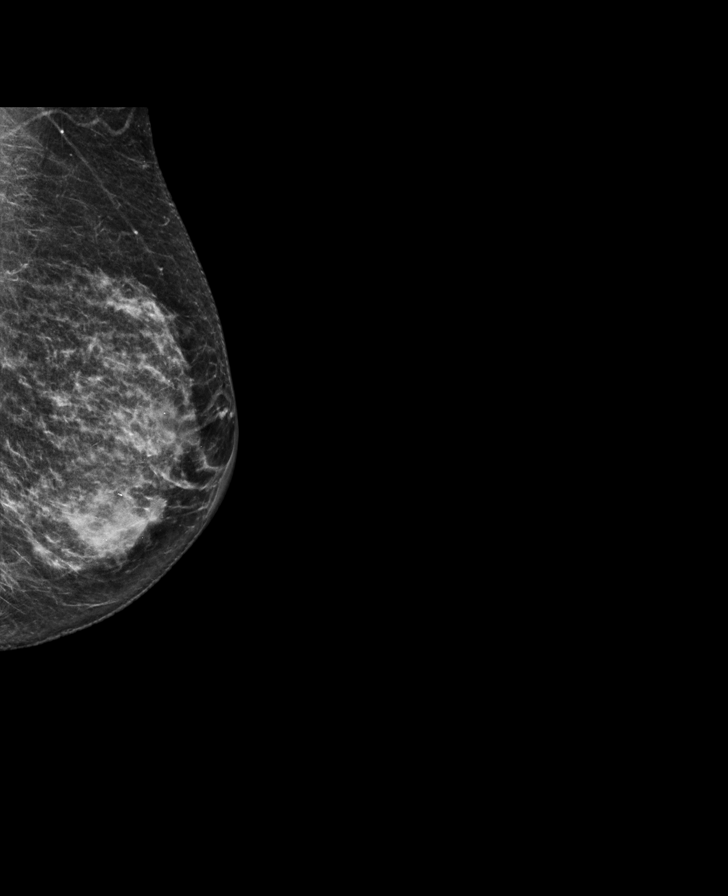

[R MLO synth-2D]
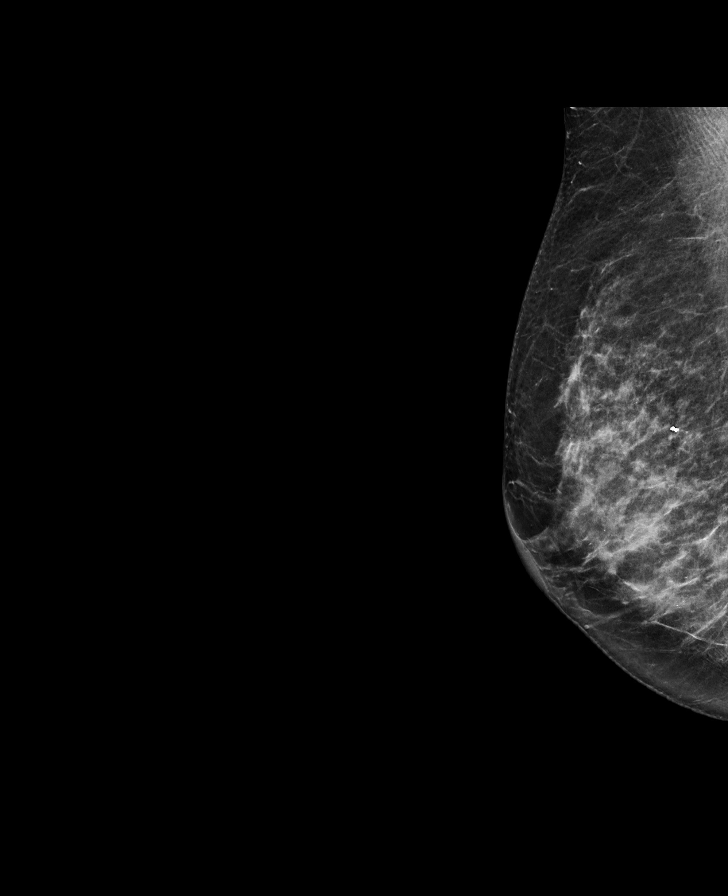

[L CC synth-2D]
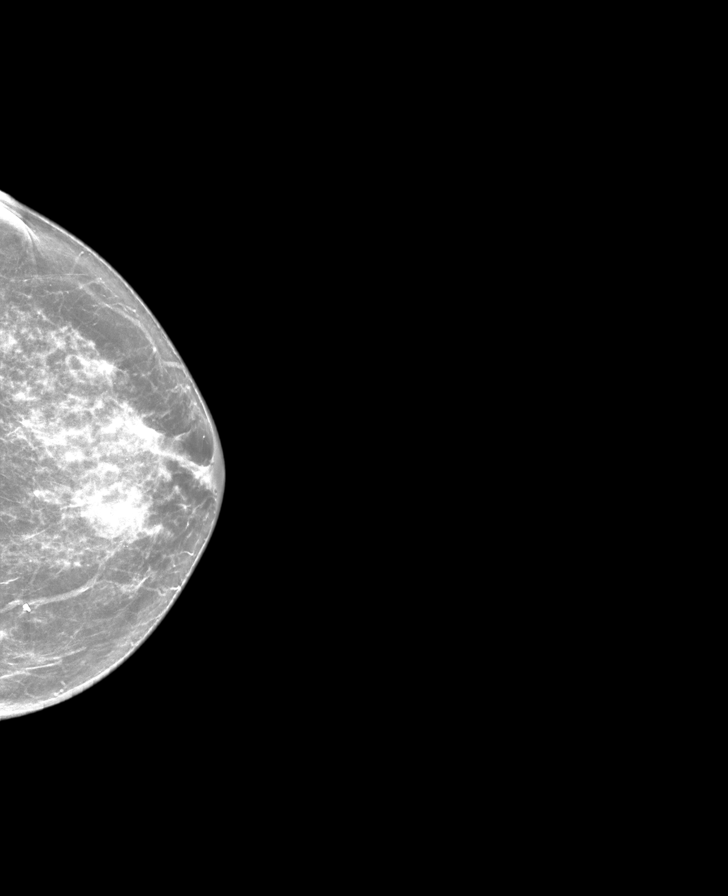

[L MLO tomo · tomo slice 31/61.0]
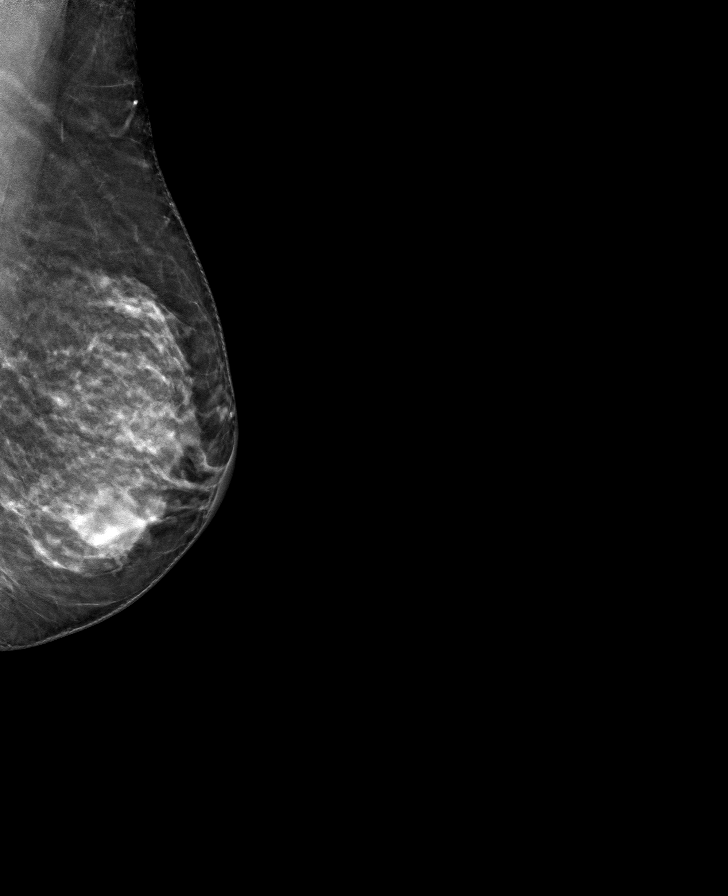

[R CC tomo · tomo slice 35/70.0]
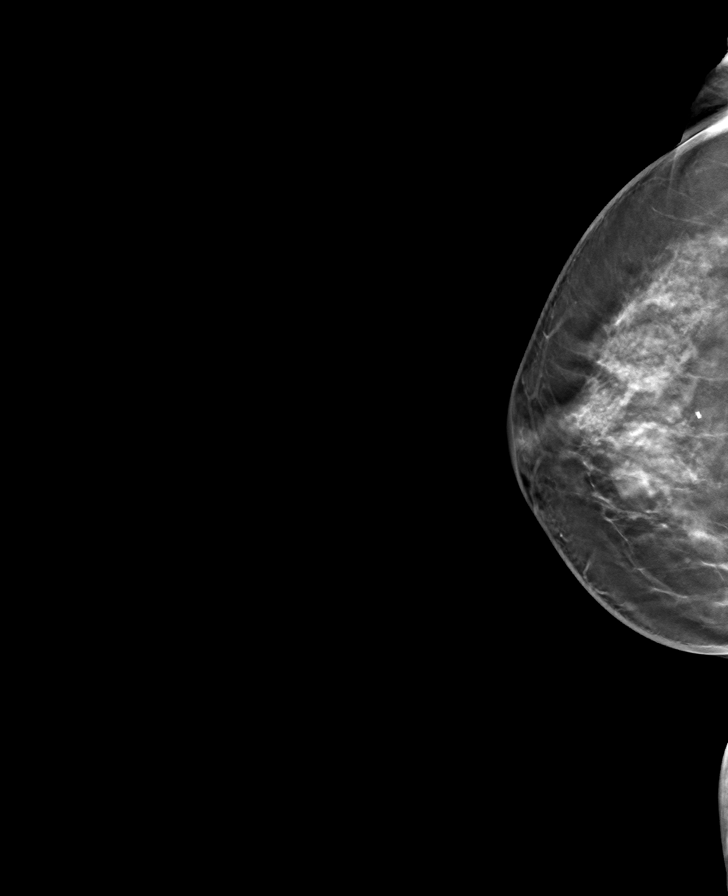

[R MLO tomo · tomo slice 31/62.0]
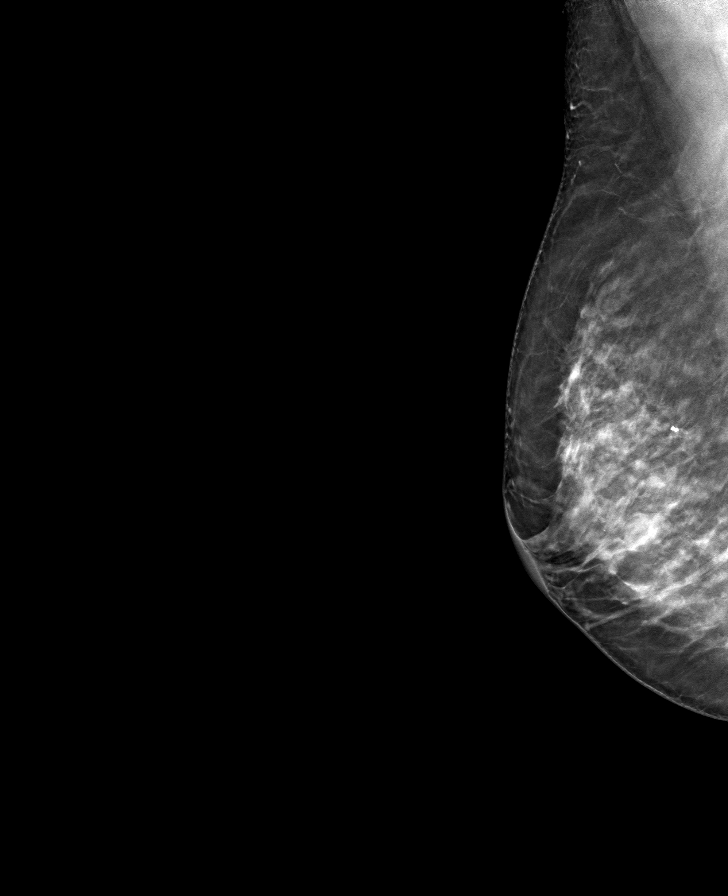

[L CC tomo · tomo slice 31/61.0]
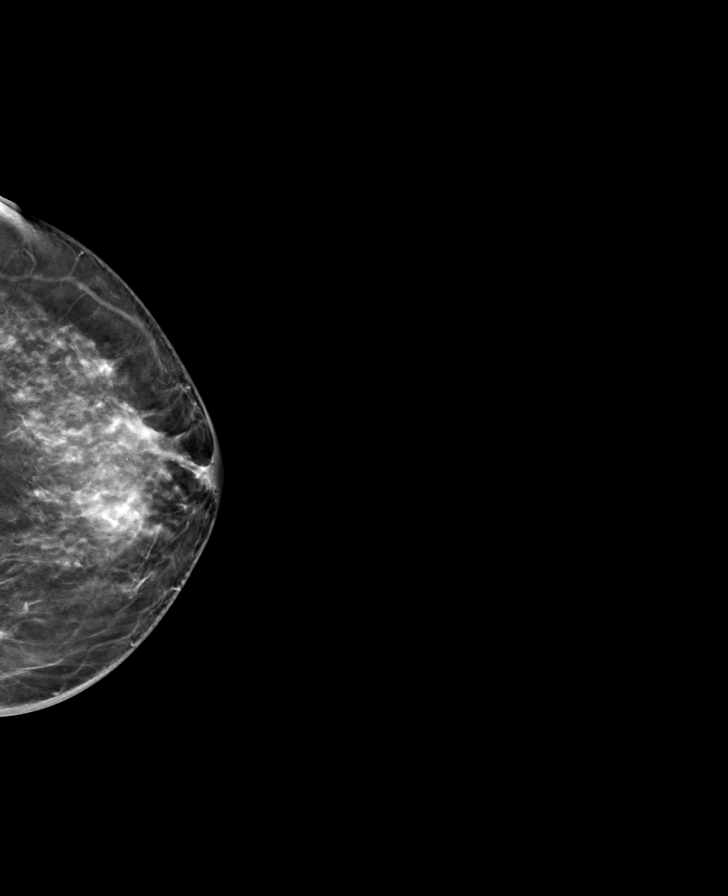

[8 of 24 positions shown; findings below may reference images not displayed]

ACR Breast Density Category c: The breast tissue is heterogeneously
dense, which may obscure small masses.
FINDINGS: In the right breast a mass requires further evaluation.

In the left breast a mass requires further evaluation.

Images were processed with CAD.
IMPRESSION: Further evaluation is suggested for possible mass in the right
breast.

Further evaluation is suggested for possible mass in the left
breast.

RECOMMENDATION:
Diagnostic mammogram and possibly ultrasound of both breasts.
(Code:VF-S-VVR)

The patient will be contacted regarding the findings, and additional
imaging will be scheduled.

BI-RADS CATEGORY  0: Incomplete. Need additional imaging evaluation
and/or prior mammograms for comparison.

## 2022-01-03 DIAGNOSIS — F331 Major depressive disorder, recurrent, moderate: Secondary | ICD-10-CM | POA: Diagnosis not present

## 2022-01-03 DIAGNOSIS — F5105 Insomnia due to other mental disorder: Secondary | ICD-10-CM | POA: Diagnosis not present

## 2022-01-03 DIAGNOSIS — F411 Generalized anxiety disorder: Secondary | ICD-10-CM | POA: Diagnosis not present

## 2022-01-06 DIAGNOSIS — R102 Pelvic and perineal pain: Secondary | ICD-10-CM | POA: Diagnosis not present

## 2022-01-06 DIAGNOSIS — H6122 Impacted cerumen, left ear: Secondary | ICD-10-CM | POA: Diagnosis not present

## 2022-01-06 DIAGNOSIS — F419 Anxiety disorder, unspecified: Secondary | ICD-10-CM | POA: Diagnosis not present

## 2022-01-08 DIAGNOSIS — Z79899 Other long term (current) drug therapy: Secondary | ICD-10-CM | POA: Diagnosis not present

## 2022-01-08 DIAGNOSIS — R350 Frequency of micturition: Secondary | ICD-10-CM | POA: Diagnosis not present

## 2022-01-08 DIAGNOSIS — R1084 Generalized abdominal pain: Secondary | ICD-10-CM | POA: Diagnosis not present

## 2022-01-08 DIAGNOSIS — F419 Anxiety disorder, unspecified: Secondary | ICD-10-CM | POA: Diagnosis not present

## 2022-01-08 DIAGNOSIS — R109 Unspecified abdominal pain: Secondary | ICD-10-CM | POA: Diagnosis not present

## 2022-01-08 DIAGNOSIS — N83291 Other ovarian cyst, right side: Secondary | ICD-10-CM | POA: Diagnosis not present

## 2022-01-08 DIAGNOSIS — Z95810 Presence of automatic (implantable) cardiac defibrillator: Secondary | ICD-10-CM | POA: Diagnosis not present

## 2022-01-09 DIAGNOSIS — D270 Benign neoplasm of right ovary: Secondary | ICD-10-CM | POA: Diagnosis not present

## 2022-01-09 DIAGNOSIS — N8110 Cystocele, unspecified: Secondary | ICD-10-CM | POA: Diagnosis not present

## 2022-01-09 DIAGNOSIS — N898 Other specified noninflammatory disorders of vagina: Secondary | ICD-10-CM | POA: Diagnosis not present

## 2022-01-10 DIAGNOSIS — D270 Benign neoplasm of right ovary: Secondary | ICD-10-CM | POA: Diagnosis not present

## 2022-01-18 DIAGNOSIS — D270 Benign neoplasm of right ovary: Secondary | ICD-10-CM | POA: Diagnosis not present

## 2022-01-19 DIAGNOSIS — R350 Frequency of micturition: Secondary | ICD-10-CM | POA: Diagnosis not present

## 2022-01-19 DIAGNOSIS — D369 Benign neoplasm, unspecified site: Secondary | ICD-10-CM | POA: Diagnosis not present

## 2022-01-19 DIAGNOSIS — F411 Generalized anxiety disorder: Secondary | ICD-10-CM | POA: Diagnosis not present

## 2022-01-19 DIAGNOSIS — N83291 Other ovarian cyst, right side: Secondary | ICD-10-CM | POA: Diagnosis not present

## 2022-01-19 DIAGNOSIS — Z79899 Other long term (current) drug therapy: Secondary | ICD-10-CM | POA: Diagnosis not present

## 2022-01-19 DIAGNOSIS — D270 Benign neoplasm of right ovary: Secondary | ICD-10-CM | POA: Diagnosis not present

## 2022-01-19 DIAGNOSIS — R1011 Right upper quadrant pain: Secondary | ICD-10-CM | POA: Diagnosis not present

## 2022-01-19 DIAGNOSIS — R188 Other ascites: Secondary | ICD-10-CM | POA: Diagnosis not present

## 2022-01-23 DIAGNOSIS — F331 Major depressive disorder, recurrent, moderate: Secondary | ICD-10-CM | POA: Diagnosis not present

## 2022-01-23 DIAGNOSIS — F411 Generalized anxiety disorder: Secondary | ICD-10-CM | POA: Diagnosis not present

## 2022-01-23 DIAGNOSIS — F5105 Insomnia due to other mental disorder: Secondary | ICD-10-CM | POA: Diagnosis not present

## 2022-01-24 DIAGNOSIS — D270 Benign neoplasm of right ovary: Secondary | ICD-10-CM | POA: Diagnosis not present

## 2022-01-24 DIAGNOSIS — I495 Sick sinus syndrome: Secondary | ICD-10-CM | POA: Diagnosis not present

## 2022-01-27 DIAGNOSIS — N736 Female pelvic peritoneal adhesions (postinfective): Secondary | ICD-10-CM | POA: Diagnosis not present

## 2022-01-27 DIAGNOSIS — D270 Benign neoplasm of right ovary: Secondary | ICD-10-CM | POA: Diagnosis not present

## 2022-01-27 DIAGNOSIS — Z7722 Contact with and (suspected) exposure to environmental tobacco smoke (acute) (chronic): Secondary | ICD-10-CM | POA: Diagnosis not present

## 2022-01-27 DIAGNOSIS — Z950 Presence of cardiac pacemaker: Secondary | ICD-10-CM | POA: Diagnosis not present

## 2022-01-27 DIAGNOSIS — G4700 Insomnia, unspecified: Secondary | ICD-10-CM | POA: Diagnosis not present

## 2022-01-27 DIAGNOSIS — N838 Other noninflammatory disorders of ovary, fallopian tube and broad ligament: Secondary | ICD-10-CM | POA: Diagnosis not present

## 2022-01-27 DIAGNOSIS — K660 Peritoneal adhesions (postprocedural) (postinfection): Secondary | ICD-10-CM | POA: Diagnosis not present

## 2022-01-27 DIAGNOSIS — Z79899 Other long term (current) drug therapy: Secondary | ICD-10-CM | POA: Diagnosis not present

## 2022-01-27 DIAGNOSIS — N9489 Other specified conditions associated with female genital organs and menstrual cycle: Secondary | ICD-10-CM | POA: Diagnosis not present

## 2022-01-27 DIAGNOSIS — N3281 Overactive bladder: Secondary | ICD-10-CM | POA: Diagnosis not present

## 2022-02-05 DIAGNOSIS — K5903 Drug induced constipation: Secondary | ICD-10-CM | POA: Diagnosis not present

## 2022-02-05 DIAGNOSIS — F419 Anxiety disorder, unspecified: Secondary | ICD-10-CM | POA: Diagnosis not present

## 2022-02-05 DIAGNOSIS — K5900 Constipation, unspecified: Secondary | ICD-10-CM | POA: Diagnosis not present

## 2022-02-05 DIAGNOSIS — T185XXA Foreign body in anus and rectum, initial encounter: Secondary | ICD-10-CM | POA: Diagnosis not present

## 2022-02-05 DIAGNOSIS — F32A Depression, unspecified: Secondary | ICD-10-CM | POA: Diagnosis not present

## 2022-02-05 DIAGNOSIS — R109 Unspecified abdominal pain: Secondary | ICD-10-CM | POA: Diagnosis not present

## 2022-02-05 DIAGNOSIS — Z79899 Other long term (current) drug therapy: Secondary | ICD-10-CM | POA: Diagnosis not present

## 2022-02-07 DIAGNOSIS — F419 Anxiety disorder, unspecified: Secondary | ICD-10-CM | POA: Diagnosis not present

## 2022-02-07 DIAGNOSIS — Z950 Presence of cardiac pacemaker: Secondary | ICD-10-CM | POA: Diagnosis not present

## 2022-02-07 DIAGNOSIS — N8110 Cystocele, unspecified: Secondary | ICD-10-CM | POA: Diagnosis not present

## 2022-02-07 DIAGNOSIS — D270 Benign neoplasm of right ovary: Secondary | ICD-10-CM | POA: Diagnosis not present

## 2022-02-13 DIAGNOSIS — K5900 Constipation, unspecified: Secondary | ICD-10-CM | POA: Diagnosis not present

## 2022-02-13 DIAGNOSIS — N905 Atrophy of vulva: Secondary | ICD-10-CM | POA: Diagnosis not present

## 2022-02-13 DIAGNOSIS — F419 Anxiety disorder, unspecified: Secondary | ICD-10-CM | POA: Diagnosis not present

## 2022-02-15 DIAGNOSIS — F411 Generalized anxiety disorder: Secondary | ICD-10-CM | POA: Diagnosis not present

## 2022-02-15 DIAGNOSIS — F331 Major depressive disorder, recurrent, moderate: Secondary | ICD-10-CM | POA: Diagnosis not present

## 2022-02-15 DIAGNOSIS — F5105 Insomnia due to other mental disorder: Secondary | ICD-10-CM | POA: Diagnosis not present

## 2022-02-18 DIAGNOSIS — Z79899 Other long term (current) drug therapy: Secondary | ICD-10-CM | POA: Diagnosis not present

## 2022-02-18 DIAGNOSIS — F410 Panic disorder [episodic paroxysmal anxiety] without agoraphobia: Secondary | ICD-10-CM | POA: Diagnosis not present

## 2022-02-18 DIAGNOSIS — F32A Depression, unspecified: Secondary | ICD-10-CM | POA: Diagnosis not present

## 2022-02-18 DIAGNOSIS — F419 Anxiety disorder, unspecified: Secondary | ICD-10-CM | POA: Diagnosis not present

## 2022-02-18 DIAGNOSIS — Z95810 Presence of automatic (implantable) cardiac defibrillator: Secondary | ICD-10-CM | POA: Diagnosis not present

## 2022-02-21 DIAGNOSIS — Z950 Presence of cardiac pacemaker: Secondary | ICD-10-CM | POA: Diagnosis not present

## 2022-02-24 DIAGNOSIS — F411 Generalized anxiety disorder: Secondary | ICD-10-CM | POA: Diagnosis not present

## 2022-02-24 DIAGNOSIS — Z950 Presence of cardiac pacemaker: Secondary | ICD-10-CM | POA: Diagnosis not present

## 2022-02-24 DIAGNOSIS — R4589 Other symptoms and signs involving emotional state: Secondary | ICD-10-CM | POA: Diagnosis not present

## 2022-02-24 DIAGNOSIS — Z8679 Personal history of other diseases of the circulatory system: Secondary | ICD-10-CM | POA: Diagnosis not present

## 2022-02-24 DIAGNOSIS — Z20822 Contact with and (suspected) exposure to covid-19: Secondary | ICD-10-CM | POA: Diagnosis not present

## 2022-02-24 DIAGNOSIS — G4700 Insomnia, unspecified: Secondary | ICD-10-CM | POA: Diagnosis not present

## 2022-02-24 DIAGNOSIS — R45851 Suicidal ideations: Secondary | ICD-10-CM | POA: Diagnosis not present

## 2022-02-24 DIAGNOSIS — F329 Major depressive disorder, single episode, unspecified: Secondary | ICD-10-CM | POA: Diagnosis not present

## 2022-02-24 DIAGNOSIS — F410 Panic disorder [episodic paroxysmal anxiety] without agoraphobia: Secondary | ICD-10-CM | POA: Diagnosis not present

## 2022-02-25 DIAGNOSIS — F332 Major depressive disorder, recurrent severe without psychotic features: Secondary | ICD-10-CM | POA: Diagnosis not present

## 2022-02-25 DIAGNOSIS — F411 Generalized anxiety disorder: Secondary | ICD-10-CM | POA: Diagnosis not present

## 2022-02-25 DIAGNOSIS — G4700 Insomnia, unspecified: Secondary | ICD-10-CM | POA: Diagnosis not present

## 2022-02-25 DIAGNOSIS — Z950 Presence of cardiac pacemaker: Secondary | ICD-10-CM | POA: Diagnosis not present

## 2022-02-25 DIAGNOSIS — I499 Cardiac arrhythmia, unspecified: Secondary | ICD-10-CM | POA: Diagnosis not present

## 2022-02-25 DIAGNOSIS — F419 Anxiety disorder, unspecified: Secondary | ICD-10-CM | POA: Diagnosis not present

## 2022-02-25 DIAGNOSIS — I959 Hypotension, unspecified: Secondary | ICD-10-CM | POA: Diagnosis not present

## 2022-02-25 DIAGNOSIS — K5900 Constipation, unspecified: Secondary | ICD-10-CM | POA: Diagnosis not present

## 2022-02-25 DIAGNOSIS — R45851 Suicidal ideations: Secondary | ICD-10-CM | POA: Diagnosis not present

## 2022-02-25 DIAGNOSIS — Z79899 Other long term (current) drug therapy: Secondary | ICD-10-CM | POA: Diagnosis not present

## 2022-02-25 DIAGNOSIS — F5105 Insomnia due to other mental disorder: Secondary | ICD-10-CM | POA: Diagnosis not present

## 2022-03-03 DIAGNOSIS — F418 Other specified anxiety disorders: Secondary | ICD-10-CM | POA: Diagnosis not present

## 2022-03-03 DIAGNOSIS — M436 Torticollis: Secondary | ICD-10-CM | POA: Diagnosis not present

## 2022-03-03 DIAGNOSIS — I495 Sick sinus syndrome: Secondary | ICD-10-CM | POA: Diagnosis not present

## 2022-03-03 DIAGNOSIS — G3184 Mild cognitive impairment, so stated: Secondary | ICD-10-CM | POA: Diagnosis not present

## 2022-03-08 DIAGNOSIS — F411 Generalized anxiety disorder: Secondary | ICD-10-CM | POA: Diagnosis not present

## 2022-03-24 DIAGNOSIS — R079 Chest pain, unspecified: Secondary | ICD-10-CM | POA: Diagnosis not present

## 2022-03-24 DIAGNOSIS — K219 Gastro-esophageal reflux disease without esophagitis: Secondary | ICD-10-CM | POA: Diagnosis not present

## 2022-03-24 DIAGNOSIS — J310 Chronic rhinitis: Secondary | ICD-10-CM | POA: Diagnosis not present

## 2022-03-24 DIAGNOSIS — R000 Tachycardia, unspecified: Secondary | ICD-10-CM | POA: Diagnosis not present

## 2022-03-24 DIAGNOSIS — G4700 Insomnia, unspecified: Secondary | ICD-10-CM | POA: Diagnosis not present

## 2022-03-24 DIAGNOSIS — N3281 Overactive bladder: Secondary | ICD-10-CM | POA: Diagnosis not present

## 2022-03-24 DIAGNOSIS — I495 Sick sinus syndrome: Secondary | ICD-10-CM | POA: Diagnosis not present

## 2022-03-24 DIAGNOSIS — M79604 Pain in right leg: Secondary | ICD-10-CM | POA: Diagnosis not present

## 2022-03-24 DIAGNOSIS — Z79899 Other long term (current) drug therapy: Secondary | ICD-10-CM | POA: Diagnosis not present

## 2022-03-24 DIAGNOSIS — R0789 Other chest pain: Secondary | ICD-10-CM | POA: Diagnosis not present

## 2022-03-24 DIAGNOSIS — M79605 Pain in left leg: Secondary | ICD-10-CM | POA: Diagnosis not present

## 2022-03-24 DIAGNOSIS — F410 Panic disorder [episodic paroxysmal anxiety] without agoraphobia: Secondary | ICD-10-CM | POA: Diagnosis not present

## 2022-03-24 DIAGNOSIS — F418 Other specified anxiety disorders: Secondary | ICD-10-CM | POA: Diagnosis not present

## 2022-03-24 DIAGNOSIS — Z95810 Presence of automatic (implantable) cardiac defibrillator: Secondary | ICD-10-CM | POA: Diagnosis not present

## 2022-03-24 DIAGNOSIS — I10 Essential (primary) hypertension: Secondary | ICD-10-CM | POA: Diagnosis not present

## 2022-03-24 DIAGNOSIS — E46 Unspecified protein-calorie malnutrition: Secondary | ICD-10-CM | POA: Diagnosis not present

## 2022-03-27 DIAGNOSIS — G4709 Other insomnia: Secondary | ICD-10-CM | POA: Diagnosis not present

## 2022-03-27 DIAGNOSIS — F410 Panic disorder [episodic paroxysmal anxiety] without agoraphobia: Secondary | ICD-10-CM | POA: Diagnosis not present

## 2022-03-27 DIAGNOSIS — Z658 Other specified problems related to psychosocial circumstances: Secondary | ICD-10-CM | POA: Diagnosis not present

## 2022-03-27 DIAGNOSIS — F411 Generalized anxiety disorder: Secondary | ICD-10-CM | POA: Diagnosis not present

## 2022-03-27 DIAGNOSIS — F3289 Other specified depressive episodes: Secondary | ICD-10-CM | POA: Diagnosis not present

## 2022-04-17 DIAGNOSIS — M47816 Spondylosis without myelopathy or radiculopathy, lumbar region: Secondary | ICD-10-CM | POA: Diagnosis not present

## 2022-04-17 DIAGNOSIS — M48061 Spinal stenosis, lumbar region without neurogenic claudication: Secondary | ICD-10-CM | POA: Diagnosis not present

## 2022-04-17 DIAGNOSIS — M533 Sacrococcygeal disorders, not elsewhere classified: Secondary | ICD-10-CM | POA: Diagnosis not present

## 2022-04-17 DIAGNOSIS — M545 Low back pain, unspecified: Secondary | ICD-10-CM | POA: Diagnosis not present

## 2022-04-18 DIAGNOSIS — M5416 Radiculopathy, lumbar region: Secondary | ICD-10-CM | POA: Diagnosis not present

## 2022-04-18 DIAGNOSIS — M545 Low back pain, unspecified: Secondary | ICD-10-CM | POA: Diagnosis not present

## 2022-04-24 DIAGNOSIS — F322 Major depressive disorder, single episode, severe without psychotic features: Secondary | ICD-10-CM | POA: Diagnosis not present

## 2022-04-24 DIAGNOSIS — F411 Generalized anxiety disorder: Secondary | ICD-10-CM | POA: Diagnosis not present

## 2022-04-24 DIAGNOSIS — M5459 Other low back pain: Secondary | ICD-10-CM | POA: Diagnosis not present

## 2022-04-24 DIAGNOSIS — M6281 Muscle weakness (generalized): Secondary | ICD-10-CM | POA: Diagnosis not present

## 2022-04-26 DIAGNOSIS — M6281 Muscle weakness (generalized): Secondary | ICD-10-CM | POA: Diagnosis not present

## 2022-04-26 DIAGNOSIS — M5459 Other low back pain: Secondary | ICD-10-CM | POA: Diagnosis not present

## 2022-04-27 DIAGNOSIS — M5441 Lumbago with sciatica, right side: Secondary | ICD-10-CM | POA: Diagnosis not present

## 2022-04-27 DIAGNOSIS — M5136 Other intervertebral disc degeneration, lumbar region: Secondary | ICD-10-CM | POA: Diagnosis not present

## 2022-04-27 DIAGNOSIS — M5459 Other low back pain: Secondary | ICD-10-CM | POA: Diagnosis not present

## 2022-04-27 DIAGNOSIS — N952 Postmenopausal atrophic vaginitis: Secondary | ICD-10-CM | POA: Diagnosis not present

## 2022-04-27 DIAGNOSIS — M6281 Muscle weakness (generalized): Secondary | ICD-10-CM | POA: Diagnosis not present

## 2022-04-27 DIAGNOSIS — G4709 Other insomnia: Secondary | ICD-10-CM | POA: Diagnosis not present

## 2022-05-01 DIAGNOSIS — F322 Major depressive disorder, single episode, severe without psychotic features: Secondary | ICD-10-CM | POA: Diagnosis not present

## 2022-05-01 DIAGNOSIS — F411 Generalized anxiety disorder: Secondary | ICD-10-CM | POA: Diagnosis not present

## 2022-05-02 DIAGNOSIS — M6281 Muscle weakness (generalized): Secondary | ICD-10-CM | POA: Diagnosis not present

## 2022-05-02 DIAGNOSIS — M5459 Other low back pain: Secondary | ICD-10-CM | POA: Diagnosis not present

## 2022-05-03 DIAGNOSIS — M6281 Muscle weakness (generalized): Secondary | ICD-10-CM | POA: Diagnosis not present

## 2022-05-03 DIAGNOSIS — M5459 Other low back pain: Secondary | ICD-10-CM | POA: Diagnosis not present

## 2022-05-05 DIAGNOSIS — M5459 Other low back pain: Secondary | ICD-10-CM | POA: Diagnosis not present

## 2022-05-05 DIAGNOSIS — M6281 Muscle weakness (generalized): Secondary | ICD-10-CM | POA: Diagnosis not present

## 2022-05-08 DIAGNOSIS — M6281 Muscle weakness (generalized): Secondary | ICD-10-CM | POA: Diagnosis not present

## 2022-05-08 DIAGNOSIS — M5459 Other low back pain: Secondary | ICD-10-CM | POA: Diagnosis not present

## 2022-05-09 DIAGNOSIS — F419 Anxiety disorder, unspecified: Secondary | ICD-10-CM | POA: Diagnosis not present

## 2022-05-09 DIAGNOSIS — G4709 Other insomnia: Secondary | ICD-10-CM | POA: Diagnosis not present

## 2022-05-09 DIAGNOSIS — G629 Polyneuropathy, unspecified: Secondary | ICD-10-CM | POA: Diagnosis not present

## 2022-05-09 DIAGNOSIS — F3289 Other specified depressive episodes: Secondary | ICD-10-CM | POA: Diagnosis not present

## 2022-05-09 DIAGNOSIS — Z658 Other specified problems related to psychosocial circumstances: Secondary | ICD-10-CM | POA: Diagnosis not present

## 2022-05-09 DIAGNOSIS — F411 Generalized anxiety disorder: Secondary | ICD-10-CM | POA: Diagnosis not present

## 2022-05-09 DIAGNOSIS — M5430 Sciatica, unspecified side: Secondary | ICD-10-CM | POA: Diagnosis not present

## 2022-05-09 DIAGNOSIS — F410 Panic disorder [episodic paroxysmal anxiety] without agoraphobia: Secondary | ICD-10-CM | POA: Diagnosis not present

## 2022-05-09 DIAGNOSIS — G4700 Insomnia, unspecified: Secondary | ICD-10-CM | POA: Diagnosis not present

## 2022-05-10 DIAGNOSIS — F322 Major depressive disorder, single episode, severe without psychotic features: Secondary | ICD-10-CM | POA: Diagnosis not present

## 2022-05-10 DIAGNOSIS — F411 Generalized anxiety disorder: Secondary | ICD-10-CM | POA: Diagnosis not present

## 2022-05-11 DIAGNOSIS — Z950 Presence of cardiac pacemaker: Secondary | ICD-10-CM | POA: Diagnosis not present

## 2022-05-11 DIAGNOSIS — M5416 Radiculopathy, lumbar region: Secondary | ICD-10-CM | POA: Diagnosis not present

## 2022-05-12 DIAGNOSIS — M954 Acquired deformity of chest and rib: Secondary | ICD-10-CM | POA: Diagnosis not present

## 2022-05-12 DIAGNOSIS — Z950 Presence of cardiac pacemaker: Secondary | ICD-10-CM | POA: Diagnosis not present

## 2022-05-12 DIAGNOSIS — I5022 Chronic systolic (congestive) heart failure: Secondary | ICD-10-CM | POA: Diagnosis not present

## 2022-05-12 DIAGNOSIS — I495 Sick sinus syndrome: Secondary | ICD-10-CM | POA: Diagnosis not present

## 2022-05-12 DIAGNOSIS — I341 Nonrheumatic mitral (valve) prolapse: Secondary | ICD-10-CM | POA: Diagnosis not present

## 2022-05-15 DIAGNOSIS — Z95810 Presence of automatic (implantable) cardiac defibrillator: Secondary | ICD-10-CM | POA: Diagnosis not present

## 2022-05-15 DIAGNOSIS — G8929 Other chronic pain: Secondary | ICD-10-CM | POA: Diagnosis not present

## 2022-05-15 DIAGNOSIS — M5441 Lumbago with sciatica, right side: Secondary | ICD-10-CM | POA: Diagnosis not present

## 2022-05-15 DIAGNOSIS — M5442 Lumbago with sciatica, left side: Secondary | ICD-10-CM | POA: Diagnosis not present

## 2022-05-15 DIAGNOSIS — R202 Paresthesia of skin: Secondary | ICD-10-CM | POA: Diagnosis not present

## 2022-05-17 DIAGNOSIS — M5459 Other low back pain: Secondary | ICD-10-CM | POA: Diagnosis not present

## 2022-05-17 DIAGNOSIS — M6281 Muscle weakness (generalized): Secondary | ICD-10-CM | POA: Diagnosis not present

## 2022-05-19 DIAGNOSIS — M5430 Sciatica, unspecified side: Secondary | ICD-10-CM | POA: Diagnosis not present

## 2022-05-19 DIAGNOSIS — Z950 Presence of cardiac pacemaker: Secondary | ICD-10-CM | POA: Diagnosis not present

## 2022-05-19 DIAGNOSIS — G629 Polyneuropathy, unspecified: Secondary | ICD-10-CM | POA: Diagnosis not present

## 2022-05-19 DIAGNOSIS — G4700 Insomnia, unspecified: Secondary | ICD-10-CM | POA: Diagnosis not present

## 2022-05-19 DIAGNOSIS — F411 Generalized anxiety disorder: Secondary | ICD-10-CM | POA: Diagnosis not present

## 2022-05-19 DIAGNOSIS — F419 Anxiety disorder, unspecified: Secondary | ICD-10-CM | POA: Diagnosis not present

## 2022-05-19 DIAGNOSIS — F322 Major depressive disorder, single episode, severe without psychotic features: Secondary | ICD-10-CM | POA: Diagnosis not present

## 2022-05-22 DIAGNOSIS — M4807 Spinal stenosis, lumbosacral region: Secondary | ICD-10-CM | POA: Diagnosis not present

## 2022-05-22 DIAGNOSIS — M4727 Other spondylosis with radiculopathy, lumbosacral region: Secondary | ICD-10-CM | POA: Diagnosis not present

## 2022-05-22 DIAGNOSIS — M5416 Radiculopathy, lumbar region: Secondary | ICD-10-CM | POA: Diagnosis not present

## 2022-05-25 DIAGNOSIS — M79604 Pain in right leg: Secondary | ICD-10-CM | POA: Diagnosis not present

## 2022-05-25 DIAGNOSIS — M79605 Pain in left leg: Secondary | ICD-10-CM | POA: Diagnosis not present

## 2022-05-25 DIAGNOSIS — F322 Major depressive disorder, single episode, severe without psychotic features: Secondary | ICD-10-CM | POA: Diagnosis not present

## 2022-05-25 DIAGNOSIS — F411 Generalized anxiety disorder: Secondary | ICD-10-CM | POA: Diagnosis not present

## 2022-05-26 DIAGNOSIS — G4700 Insomnia, unspecified: Secondary | ICD-10-CM | POA: Diagnosis not present

## 2022-05-26 DIAGNOSIS — E559 Vitamin D deficiency, unspecified: Secondary | ICD-10-CM | POA: Diagnosis not present

## 2022-05-26 DIAGNOSIS — G629 Polyneuropathy, unspecified: Secondary | ICD-10-CM | POA: Diagnosis not present

## 2022-05-26 DIAGNOSIS — F419 Anxiety disorder, unspecified: Secondary | ICD-10-CM | POA: Diagnosis not present

## 2022-05-26 DIAGNOSIS — K5900 Constipation, unspecified: Secondary | ICD-10-CM | POA: Diagnosis not present

## 2022-05-26 DIAGNOSIS — Z79899 Other long term (current) drug therapy: Secondary | ICD-10-CM | POA: Diagnosis not present

## 2022-05-29 DIAGNOSIS — M6281 Muscle weakness (generalized): Secondary | ICD-10-CM | POA: Diagnosis not present

## 2022-05-29 DIAGNOSIS — M5459 Other low back pain: Secondary | ICD-10-CM | POA: Diagnosis not present

## 2022-05-30 DIAGNOSIS — M5459 Other low back pain: Secondary | ICD-10-CM | POA: Diagnosis not present

## 2022-05-30 DIAGNOSIS — M6281 Muscle weakness (generalized): Secondary | ICD-10-CM | POA: Diagnosis not present

## 2022-05-31 DIAGNOSIS — E559 Vitamin D deficiency, unspecified: Secondary | ICD-10-CM | POA: Diagnosis not present

## 2022-05-31 DIAGNOSIS — Z79899 Other long term (current) drug therapy: Secondary | ICD-10-CM | POA: Diagnosis not present

## 2022-05-31 DIAGNOSIS — F411 Generalized anxiety disorder: Secondary | ICD-10-CM | POA: Diagnosis not present

## 2022-05-31 DIAGNOSIS — F322 Major depressive disorder, single episode, severe without psychotic features: Secondary | ICD-10-CM | POA: Diagnosis not present

## 2022-06-05 DIAGNOSIS — M5459 Other low back pain: Secondary | ICD-10-CM | POA: Diagnosis not present

## 2022-06-05 DIAGNOSIS — M6281 Muscle weakness (generalized): Secondary | ICD-10-CM | POA: Diagnosis not present

## 2022-06-06 DIAGNOSIS — M6281 Muscle weakness (generalized): Secondary | ICD-10-CM | POA: Diagnosis not present

## 2022-06-06 DIAGNOSIS — M5459 Other low back pain: Secondary | ICD-10-CM | POA: Diagnosis not present

## 2022-06-07 DIAGNOSIS — F322 Major depressive disorder, single episode, severe without psychotic features: Secondary | ICD-10-CM | POA: Diagnosis not present

## 2022-06-07 DIAGNOSIS — F411 Generalized anxiety disorder: Secondary | ICD-10-CM | POA: Diagnosis not present

## 2022-06-09 DIAGNOSIS — G8929 Other chronic pain: Secondary | ICD-10-CM | POA: Diagnosis not present

## 2022-06-09 DIAGNOSIS — F411 Generalized anxiety disorder: Secondary | ICD-10-CM | POA: Diagnosis not present

## 2022-06-09 DIAGNOSIS — M19071 Primary osteoarthritis, right ankle and foot: Secondary | ICD-10-CM | POA: Diagnosis not present

## 2022-06-09 DIAGNOSIS — Z1331 Encounter for screening for depression: Secondary | ICD-10-CM | POA: Diagnosis not present

## 2022-06-09 DIAGNOSIS — M170 Bilateral primary osteoarthritis of knee: Secondary | ICD-10-CM | POA: Diagnosis not present

## 2022-06-09 DIAGNOSIS — Z950 Presence of cardiac pacemaker: Secondary | ICD-10-CM | POA: Diagnosis not present

## 2022-06-09 DIAGNOSIS — M79671 Pain in right foot: Secondary | ICD-10-CM | POA: Diagnosis not present

## 2022-06-09 DIAGNOSIS — R1030 Lower abdominal pain, unspecified: Secondary | ICD-10-CM | POA: Diagnosis not present

## 2022-06-09 DIAGNOSIS — Z1330 Encounter for screening examination for mental health and behavioral disorders, unspecified: Secondary | ICD-10-CM | POA: Diagnosis not present

## 2022-06-09 DIAGNOSIS — F5105 Insomnia due to other mental disorder: Secondary | ICD-10-CM | POA: Diagnosis not present

## 2022-06-09 DIAGNOSIS — M7731 Calcaneal spur, right foot: Secondary | ICD-10-CM | POA: Diagnosis not present

## 2022-06-09 DIAGNOSIS — M25562 Pain in left knee: Secondary | ICD-10-CM | POA: Diagnosis not present

## 2022-06-09 DIAGNOSIS — M25561 Pain in right knee: Secondary | ICD-10-CM | POA: Diagnosis not present

## 2022-06-09 DIAGNOSIS — M2011 Hallux valgus (acquired), right foot: Secondary | ICD-10-CM | POA: Diagnosis not present

## 2022-06-15 DIAGNOSIS — F322 Major depressive disorder, single episode, severe without psychotic features: Secondary | ICD-10-CM | POA: Diagnosis not present

## 2022-06-15 DIAGNOSIS — F411 Generalized anxiety disorder: Secondary | ICD-10-CM | POA: Diagnosis not present

## 2022-06-16 DIAGNOSIS — G629 Polyneuropathy, unspecified: Secondary | ICD-10-CM | POA: Diagnosis not present

## 2022-06-16 DIAGNOSIS — G4700 Insomnia, unspecified: Secondary | ICD-10-CM | POA: Diagnosis not present

## 2022-06-16 DIAGNOSIS — K5901 Slow transit constipation: Secondary | ICD-10-CM | POA: Diagnosis not present

## 2022-06-16 DIAGNOSIS — F419 Anxiety disorder, unspecified: Secondary | ICD-10-CM | POA: Diagnosis not present

## 2022-06-22 DIAGNOSIS — F411 Generalized anxiety disorder: Secondary | ICD-10-CM | POA: Diagnosis not present

## 2022-06-22 DIAGNOSIS — F322 Major depressive disorder, single episode, severe without psychotic features: Secondary | ICD-10-CM | POA: Diagnosis not present

## 2022-06-23 DIAGNOSIS — R202 Paresthesia of skin: Secondary | ICD-10-CM | POA: Diagnosis not present

## 2022-06-23 DIAGNOSIS — M79604 Pain in right leg: Secondary | ICD-10-CM | POA: Diagnosis not present

## 2022-06-27 DIAGNOSIS — Z658 Other specified problems related to psychosocial circumstances: Secondary | ICD-10-CM | POA: Diagnosis not present

## 2022-06-27 DIAGNOSIS — F411 Generalized anxiety disorder: Secondary | ICD-10-CM | POA: Diagnosis not present

## 2022-06-27 DIAGNOSIS — G4709 Other insomnia: Secondary | ICD-10-CM | POA: Diagnosis not present

## 2022-06-27 DIAGNOSIS — F410 Panic disorder [episodic paroxysmal anxiety] without agoraphobia: Secondary | ICD-10-CM | POA: Diagnosis not present

## 2022-06-27 DIAGNOSIS — F3289 Other specified depressive episodes: Secondary | ICD-10-CM | POA: Diagnosis not present

## 2022-06-29 DIAGNOSIS — F322 Major depressive disorder, single episode, severe without psychotic features: Secondary | ICD-10-CM | POA: Diagnosis not present

## 2022-06-29 DIAGNOSIS — F411 Generalized anxiety disorder: Secondary | ICD-10-CM | POA: Diagnosis not present

## 2022-06-30 DIAGNOSIS — G629 Polyneuropathy, unspecified: Secondary | ICD-10-CM | POA: Diagnosis not present

## 2022-06-30 DIAGNOSIS — G4700 Insomnia, unspecified: Secondary | ICD-10-CM | POA: Diagnosis not present

## 2022-06-30 DIAGNOSIS — I495 Sick sinus syndrome: Secondary | ICD-10-CM | POA: Diagnosis not present

## 2022-06-30 DIAGNOSIS — F419 Anxiety disorder, unspecified: Secondary | ICD-10-CM | POA: Diagnosis not present

## 2022-07-05 DIAGNOSIS — I5022 Chronic systolic (congestive) heart failure: Secondary | ICD-10-CM | POA: Diagnosis not present

## 2022-07-05 DIAGNOSIS — I429 Cardiomyopathy, unspecified: Secondary | ICD-10-CM | POA: Diagnosis not present

## 2022-07-05 DIAGNOSIS — Z23 Encounter for immunization: Secondary | ICD-10-CM | POA: Diagnosis not present

## 2022-07-05 DIAGNOSIS — Z950 Presence of cardiac pacemaker: Secondary | ICD-10-CM | POA: Diagnosis not present

## 2022-07-05 DIAGNOSIS — T82897A Other specified complication of cardiac prosthetic devices, implants and grafts, initial encounter: Secondary | ICD-10-CM | POA: Diagnosis not present

## 2022-07-11 DIAGNOSIS — Z79899 Other long term (current) drug therapy: Secondary | ICD-10-CM | POA: Diagnosis not present

## 2022-07-11 DIAGNOSIS — I442 Atrioventricular block, complete: Secondary | ICD-10-CM | POA: Diagnosis not present

## 2022-07-11 DIAGNOSIS — I5022 Chronic systolic (congestive) heart failure: Secondary | ICD-10-CM | POA: Diagnosis not present

## 2022-07-11 DIAGNOSIS — I428 Other cardiomyopathies: Secondary | ICD-10-CM | POA: Diagnosis not present

## 2022-07-11 DIAGNOSIS — Z950 Presence of cardiac pacemaker: Secondary | ICD-10-CM | POA: Diagnosis not present

## 2022-07-12 DIAGNOSIS — I5022 Chronic systolic (congestive) heart failure: Secondary | ICD-10-CM | POA: Diagnosis not present

## 2022-07-12 DIAGNOSIS — Z452 Encounter for adjustment and management of vascular access device: Secondary | ICD-10-CM | POA: Diagnosis not present

## 2022-07-12 DIAGNOSIS — Z950 Presence of cardiac pacemaker: Secondary | ICD-10-CM | POA: Diagnosis not present

## 2022-07-12 DIAGNOSIS — I442 Atrioventricular block, complete: Secondary | ICD-10-CM | POA: Diagnosis not present

## 2022-07-12 DIAGNOSIS — R918 Other nonspecific abnormal finding of lung field: Secondary | ICD-10-CM | POA: Diagnosis not present

## 2022-07-12 DIAGNOSIS — Z79899 Other long term (current) drug therapy: Secondary | ICD-10-CM | POA: Diagnosis not present

## 2022-07-12 DIAGNOSIS — I428 Other cardiomyopathies: Secondary | ICD-10-CM | POA: Diagnosis not present

## 2022-07-14 DIAGNOSIS — G629 Polyneuropathy, unspecified: Secondary | ICD-10-CM | POA: Diagnosis not present

## 2022-07-14 DIAGNOSIS — H6123 Impacted cerumen, bilateral: Secondary | ICD-10-CM | POA: Diagnosis not present

## 2022-07-14 DIAGNOSIS — G4700 Insomnia, unspecified: Secondary | ICD-10-CM | POA: Diagnosis not present

## 2022-07-14 DIAGNOSIS — I495 Sick sinus syndrome: Secondary | ICD-10-CM | POA: Diagnosis not present

## 2022-07-14 DIAGNOSIS — F418 Other specified anxiety disorders: Secondary | ICD-10-CM | POA: Diagnosis not present

## 2022-07-17 DIAGNOSIS — Z79899 Other long term (current) drug therapy: Secondary | ICD-10-CM | POA: Diagnosis not present

## 2022-07-17 DIAGNOSIS — K5900 Constipation, unspecified: Secondary | ICD-10-CM | POA: Diagnosis not present

## 2022-07-17 DIAGNOSIS — R109 Unspecified abdominal pain: Secondary | ICD-10-CM | POA: Diagnosis not present

## 2022-07-17 DIAGNOSIS — R1084 Generalized abdominal pain: Secondary | ICD-10-CM | POA: Diagnosis not present

## 2022-07-17 DIAGNOSIS — I10 Essential (primary) hypertension: Secondary | ICD-10-CM | POA: Diagnosis not present

## 2022-07-17 DIAGNOSIS — R1030 Lower abdominal pain, unspecified: Secondary | ICD-10-CM | POA: Diagnosis not present

## 2022-07-17 DIAGNOSIS — Z95810 Presence of automatic (implantable) cardiac defibrillator: Secondary | ICD-10-CM | POA: Diagnosis not present

## 2022-07-18 DIAGNOSIS — N905 Atrophy of vulva: Secondary | ICD-10-CM | POA: Diagnosis not present

## 2022-07-18 DIAGNOSIS — N952 Postmenopausal atrophic vaginitis: Secondary | ICD-10-CM | POA: Diagnosis not present

## 2022-07-18 DIAGNOSIS — R350 Frequency of micturition: Secondary | ICD-10-CM | POA: Diagnosis not present

## 2022-07-18 DIAGNOSIS — N958 Other specified menopausal and perimenopausal disorders: Secondary | ICD-10-CM | POA: Diagnosis not present

## 2022-07-18 DIAGNOSIS — N898 Other specified noninflammatory disorders of vagina: Secondary | ICD-10-CM | POA: Diagnosis not present

## 2022-08-03 DIAGNOSIS — Z950 Presence of cardiac pacemaker: Secondary | ICD-10-CM | POA: Diagnosis not present

## 2022-08-11 DIAGNOSIS — I495 Sick sinus syndrome: Secondary | ICD-10-CM | POA: Diagnosis not present

## 2022-08-11 DIAGNOSIS — I5022 Chronic systolic (congestive) heart failure: Secondary | ICD-10-CM | POA: Diagnosis not present

## 2022-08-11 DIAGNOSIS — G4700 Insomnia, unspecified: Secondary | ICD-10-CM | POA: Diagnosis not present

## 2022-08-11 DIAGNOSIS — R519 Headache, unspecified: Secondary | ICD-10-CM | POA: Diagnosis not present

## 2022-08-11 DIAGNOSIS — N952 Postmenopausal atrophic vaginitis: Secondary | ICD-10-CM | POA: Diagnosis not present

## 2022-08-11 DIAGNOSIS — F411 Generalized anxiety disorder: Secondary | ICD-10-CM | POA: Diagnosis not present

## 2022-08-11 DIAGNOSIS — F4310 Post-traumatic stress disorder, unspecified: Secondary | ICD-10-CM | POA: Diagnosis not present

## 2022-08-11 DIAGNOSIS — M170 Bilateral primary osteoarthritis of knee: Secondary | ICD-10-CM | POA: Diagnosis not present

## 2022-08-29 DIAGNOSIS — Z111 Encounter for screening for respiratory tuberculosis: Secondary | ICD-10-CM | POA: Diagnosis not present

## 2022-08-31 DIAGNOSIS — I5022 Chronic systolic (congestive) heart failure: Secondary | ICD-10-CM | POA: Diagnosis not present

## 2022-08-31 DIAGNOSIS — R1084 Generalized abdominal pain: Secondary | ICD-10-CM | POA: Diagnosis not present

## 2022-08-31 DIAGNOSIS — Z9109 Other allergy status, other than to drugs and biological substances: Secondary | ICD-10-CM | POA: Diagnosis not present

## 2022-08-31 DIAGNOSIS — R002 Palpitations: Secondary | ICD-10-CM | POA: Diagnosis not present

## 2022-08-31 DIAGNOSIS — K5900 Constipation, unspecified: Secondary | ICD-10-CM | POA: Diagnosis not present

## 2022-08-31 DIAGNOSIS — Z888 Allergy status to other drugs, medicaments and biological substances status: Secondary | ICD-10-CM | POA: Diagnosis not present

## 2022-08-31 DIAGNOSIS — R102 Pelvic and perineal pain: Secondary | ICD-10-CM | POA: Diagnosis not present

## 2022-08-31 DIAGNOSIS — R109 Unspecified abdominal pain: Secondary | ICD-10-CM | POA: Diagnosis not present

## 2022-09-01 DIAGNOSIS — R002 Palpitations: Secondary | ICD-10-CM | POA: Diagnosis not present

## 2022-09-01 DIAGNOSIS — K5900 Constipation, unspecified: Secondary | ICD-10-CM | POA: Diagnosis not present

## 2022-09-01 DIAGNOSIS — R1084 Generalized abdominal pain: Secondary | ICD-10-CM | POA: Diagnosis not present

## 2022-09-18 DIAGNOSIS — Z743 Need for continuous supervision: Secondary | ICD-10-CM | POA: Diagnosis not present

## 2022-09-18 DIAGNOSIS — B974 Respiratory syncytial virus as the cause of diseases classified elsewhere: Secondary | ICD-10-CM | POA: Diagnosis not present

## 2022-09-18 DIAGNOSIS — R002 Palpitations: Secondary | ICD-10-CM | POA: Diagnosis not present

## 2022-09-18 DIAGNOSIS — R030 Elevated blood-pressure reading, without diagnosis of hypertension: Secondary | ICD-10-CM | POA: Diagnosis not present

## 2022-09-18 DIAGNOSIS — J029 Acute pharyngitis, unspecified: Secondary | ICD-10-CM | POA: Diagnosis not present

## 2022-09-18 DIAGNOSIS — I700 Atherosclerosis of aorta: Secondary | ICD-10-CM | POA: Diagnosis not present

## 2022-09-18 DIAGNOSIS — Z888 Allergy status to other drugs, medicaments and biological substances status: Secondary | ICD-10-CM | POA: Diagnosis not present

## 2022-09-18 DIAGNOSIS — J111 Influenza due to unidentified influenza virus with other respiratory manifestations: Secondary | ICD-10-CM | POA: Diagnosis not present
